# Patient Record
Sex: Female | Born: 1980
Health system: Southern US, Community
[De-identification: ages and names within clinical notes are randomized; demographics above are authoritative.]

## PROBLEM LIST (undated history)

## (undated) DIAGNOSIS — I10 Essential (primary) hypertension: Secondary | ICD-10-CM

## (undated) DIAGNOSIS — R519 Headache, unspecified: Secondary | ICD-10-CM

## (undated) DIAGNOSIS — R200 Anesthesia of skin: Secondary | ICD-10-CM

## (undated) DIAGNOSIS — G43909 Migraine, unspecified, not intractable, without status migrainosus: Secondary | ICD-10-CM

## (undated) DIAGNOSIS — R202 Paresthesia of skin: Secondary | ICD-10-CM

## (undated) HISTORY — PX: SPINAL FUSION: SHX223

## (undated) HISTORY — DX: Headache, unspecified: R51.9

## (undated) HISTORY — DX: Paresthesia of skin: R20.2

## (undated) HISTORY — DX: Essential (primary) hypertension: I10

## (undated) HISTORY — DX: Migraine, unspecified, not intractable, without status migrainosus: G43.909

## (undated) HISTORY — PX: BREAST EXCISIONAL BIOPSY: SUR124

## (undated) HISTORY — DX: Anesthesia of skin: R20.0

---

## 2004-06-20 ENCOUNTER — Emergency Department: Payer: Self-pay | Admitting: Emergency Medicine

## 2004-10-16 ENCOUNTER — Emergency Department: Payer: Self-pay | Admitting: Emergency Medicine

## 2004-12-17 ENCOUNTER — Encounter: Payer: Self-pay | Admitting: Neurosurgery

## 2004-12-27 ENCOUNTER — Encounter: Payer: Self-pay | Admitting: Neurosurgery

## 2005-01-27 ENCOUNTER — Encounter: Payer: Self-pay | Admitting: Neurosurgery

## 2005-01-31 ENCOUNTER — Observation Stay (HOSPITAL_COMMUNITY): Admission: RE | Admit: 2005-01-31 | Discharge: 2005-02-01 | Payer: Self-pay | Admitting: Neurosurgery

## 2005-02-01 ENCOUNTER — Emergency Department: Payer: Self-pay | Admitting: Emergency Medicine

## 2005-02-24 ENCOUNTER — Encounter: Admission: RE | Admit: 2005-02-24 | Discharge: 2005-02-24 | Payer: Self-pay | Admitting: Neurosurgery

## 2005-04-22 ENCOUNTER — Encounter: Admission: RE | Admit: 2005-04-22 | Discharge: 2005-04-22 | Payer: Self-pay | Admitting: Neurosurgery

## 2005-05-10 ENCOUNTER — Encounter: Payer: Self-pay | Admitting: Neurosurgery

## 2005-05-29 ENCOUNTER — Encounter: Payer: Self-pay | Admitting: Neurosurgery

## 2005-06-29 ENCOUNTER — Encounter: Payer: Self-pay | Admitting: Neurosurgery

## 2005-07-29 ENCOUNTER — Encounter: Payer: Self-pay | Admitting: Neurosurgery

## 2005-08-29 ENCOUNTER — Encounter: Payer: Self-pay | Admitting: Neurosurgery

## 2005-09-29 ENCOUNTER — Encounter: Payer: Self-pay | Admitting: Neurosurgery

## 2005-10-27 ENCOUNTER — Encounter: Payer: Self-pay | Admitting: Neurosurgery

## 2007-05-08 ENCOUNTER — Ambulatory Visit: Payer: Self-pay | Admitting: General Practice

## 2007-12-13 ENCOUNTER — Ambulatory Visit: Payer: Self-pay | Admitting: Anesthesiology

## 2007-12-31 ENCOUNTER — Ambulatory Visit: Payer: Self-pay | Admitting: Anesthesiology

## 2008-09-05 ENCOUNTER — Observation Stay: Payer: Self-pay | Admitting: Obstetrics and Gynecology

## 2008-11-07 ENCOUNTER — Observation Stay: Payer: Self-pay | Admitting: Obstetrics and Gynecology

## 2008-11-10 ENCOUNTER — Observation Stay: Payer: Self-pay | Admitting: Obstetrics and Gynecology

## 2008-11-13 ENCOUNTER — Observation Stay: Payer: Self-pay | Admitting: Obstetrics and Gynecology

## 2008-12-01 ENCOUNTER — Ambulatory Visit: Payer: Self-pay | Admitting: Obstetrics and Gynecology

## 2008-12-02 ENCOUNTER — Inpatient Hospital Stay: Payer: Self-pay | Admitting: Obstetrics and Gynecology

## 2009-02-18 ENCOUNTER — Ambulatory Visit: Payer: Self-pay | Admitting: Anesthesiology

## 2009-03-18 ENCOUNTER — Ambulatory Visit: Payer: Self-pay | Admitting: Anesthesiology

## 2009-05-19 ENCOUNTER — Ambulatory Visit: Payer: Self-pay | Admitting: Anesthesiology

## 2009-07-21 ENCOUNTER — Ambulatory Visit: Payer: Self-pay | Admitting: Anesthesiology

## 2009-09-30 ENCOUNTER — Ambulatory Visit: Payer: Self-pay | Admitting: Anesthesiology

## 2009-12-24 ENCOUNTER — Ambulatory Visit: Payer: Self-pay | Admitting: Anesthesiology

## 2010-02-16 ENCOUNTER — Ambulatory Visit: Payer: Self-pay | Admitting: Anesthesiology

## 2010-11-10 ENCOUNTER — Ambulatory Visit: Payer: Self-pay | Admitting: Pain Medicine

## 2011-01-10 ENCOUNTER — Ambulatory Visit: Payer: Self-pay | Admitting: Pain Medicine

## 2011-05-25 ENCOUNTER — Ambulatory Visit: Payer: Self-pay | Admitting: General Surgery

## 2011-06-06 ENCOUNTER — Ambulatory Visit: Payer: Self-pay | Admitting: Anesthesiology

## 2011-06-10 ENCOUNTER — Ambulatory Visit: Payer: Self-pay | Admitting: General Surgery

## 2011-06-13 HISTORY — PX: BREAST BIOPSY: SHX20

## 2011-06-13 LAB — PATHOLOGY REPORT

## 2015-08-15 ENCOUNTER — Other Ambulatory Visit: Payer: Self-pay | Admitting: Physician Assistant

## 2015-10-26 ENCOUNTER — Other Ambulatory Visit: Payer: Self-pay | Admitting: Family Medicine

## 2015-10-26 DIAGNOSIS — Z1231 Encounter for screening mammogram for malignant neoplasm of breast: Secondary | ICD-10-CM

## 2015-11-18 ENCOUNTER — Other Ambulatory Visit: Payer: Self-pay | Admitting: Physician Assistant

## 2015-11-25 ENCOUNTER — Ambulatory Visit: Payer: Self-pay

## 2015-12-06 ENCOUNTER — Other Ambulatory Visit: Payer: Self-pay | Admitting: Physician Assistant

## 2015-12-07 ENCOUNTER — Other Ambulatory Visit: Payer: Self-pay | Admitting: Physician Assistant

## 2016-04-28 ENCOUNTER — Encounter: Payer: Self-pay | Admitting: Obstetrics and Gynecology

## 2016-11-21 ENCOUNTER — Inpatient Hospital Stay
Admission: RE | Admit: 2016-11-21 | Discharge: 2016-11-21 | Disposition: A | Discharge status: Home / Self Care | Payer: Self-pay | Source: Ambulatory Visit | Attending: *Deleted | Admitting: *Deleted

## 2016-11-21 ENCOUNTER — Other Ambulatory Visit: Payer: Self-pay | Admitting: *Deleted

## 2016-11-21 ENCOUNTER — Other Ambulatory Visit: Payer: Self-pay | Admitting: Family Medicine

## 2016-11-21 DIAGNOSIS — Z1231 Encounter for screening mammogram for malignant neoplasm of breast: Secondary | ICD-10-CM

## 2016-11-30 ENCOUNTER — Ambulatory Visit
Admission: RE | Admit: 2016-11-30 | Discharge: 2016-11-30 | Disposition: A | Discharge status: Home / Self Care | Payer: BLUE CROSS/BLUE SHIELD | Source: Ambulatory Visit | Attending: Family Medicine | Admitting: Family Medicine

## 2016-11-30 DIAGNOSIS — Z1231 Encounter for screening mammogram for malignant neoplasm of breast: Secondary | ICD-10-CM

## 2019-01-22 ENCOUNTER — Other Ambulatory Visit: Payer: Self-pay

## 2019-01-22 ENCOUNTER — Ambulatory Visit
Admission: RE | Admit: 2019-01-22 | Discharge: 2019-01-22 | Disposition: A | Discharge status: Home / Self Care | Payer: BLUE CROSS/BLUE SHIELD | Source: Ambulatory Visit | Attending: Pain Medicine | Admitting: Pain Medicine

## 2019-01-22 ENCOUNTER — Ambulatory Visit
Admission: RE | Admit: 2019-01-22 | Discharge: 2019-01-22 | Disposition: A | Discharge status: Home / Self Care | Payer: BLUE CROSS/BLUE SHIELD | Attending: Pain Medicine | Admitting: Pain Medicine

## 2019-01-22 ENCOUNTER — Other Ambulatory Visit: Payer: Self-pay | Admitting: Pain Medicine

## 2019-01-22 DIAGNOSIS — M545 Low back pain, unspecified: Secondary | ICD-10-CM

## 2019-01-29 ENCOUNTER — Other Ambulatory Visit: Payer: Self-pay | Admitting: Pain Medicine

## 2019-01-29 DIAGNOSIS — M545 Low back pain, unspecified: Secondary | ICD-10-CM

## 2019-02-26 ENCOUNTER — Telehealth: Payer: Self-pay | Admitting: Internal Medicine

## 2019-02-26 ENCOUNTER — Telehealth: Payer: Self-pay | Admitting: *Deleted

## 2019-02-26 ENCOUNTER — Other Ambulatory Visit: Payer: BLUE CROSS/BLUE SHIELD

## 2019-02-26 DIAGNOSIS — M545 Low back pain, unspecified: Secondary | ICD-10-CM

## 2019-02-26 DIAGNOSIS — Z20822 Contact with and (suspected) exposure to covid-19: Secondary | ICD-10-CM

## 2019-02-26 NOTE — Telephone Encounter (Signed)
°  1. Do you have a fever? Low grade (100) 2. Are you having chills? no 3. Do you have a sore throat? no 4. Are you experiencing resp S/Sx -cough, SOB? yes 5. Do you have muscle aches? yes 6. Are you experiencing N/V/D? yes 7. Experiencing loss of sense of taste and/or smell? no 8. Do you have a headache? no 9. Have you had contact with a person confirmed Positive for Covid-19? maybe 10. Have you traveled outside of [place]? No  Symptoms started about a week ago. She also has extreme sweating. [place] and just found out that couple of the servers/cooks were she ate every day tested +.   Lives with her girlfriend and 2 kids. They were all on the trip also. None of them have symptoms but her. Her girlfriend Cecille Rubin works for [employer]. She is off till Friday of this week and [place] [employer].

## 2019-02-26 NOTE — Telephone Encounter (Signed)
[facility]- request COVID testing  Patient is symptomatic

## 2019-02-26 NOTE — Telephone Encounter (Signed)
Reviewed with Dr. Roxan Hockey.  Said because a restaurant is involved, need to refer April Griffith to the [facility].  Spoke with Francisca & advised her to contact [facility] for guidance & gave her their [place] phone number  [phone].  Advised her to keep [place] informed.

## 2019-03-05 ENCOUNTER — Other Ambulatory Visit: Payer: BLUE CROSS/BLUE SHIELD

## 2019-03-05 LAB — NOVEL CORONAVIRUS, NAA: SARS-CoV-2, NAA: NOT DETECTED

## 2019-03-20 DIAGNOSIS — G894 Chronic pain syndrome: Secondary | ICD-10-CM | POA: Diagnosis not present

## 2019-03-20 DIAGNOSIS — Z79891 Long term (current) use of opiate analgesic: Secondary | ICD-10-CM | POA: Diagnosis not present

## 2019-03-20 DIAGNOSIS — M961 Postlaminectomy syndrome, not elsewhere classified: Secondary | ICD-10-CM | POA: Diagnosis not present

## 2019-03-20 DIAGNOSIS — Z79899 Other long term (current) drug therapy: Secondary | ICD-10-CM | POA: Diagnosis not present

## 2019-03-20 DIAGNOSIS — M5136 Other intervertebral disc degeneration, lumbar region: Secondary | ICD-10-CM | POA: Diagnosis not present

## 2019-03-20 DIAGNOSIS — M545 Low back pain: Secondary | ICD-10-CM | POA: Diagnosis not present

## 2019-03-23 ENCOUNTER — Other Ambulatory Visit: Payer: Self-pay

## 2019-03-23 ENCOUNTER — Ambulatory Visit
Admission: RE | Admit: 2019-03-23 | Discharge: 2019-03-23 | Disposition: A | Discharge status: Home / Self Care | Payer: 59 | Source: Ambulatory Visit | Attending: Pain Medicine | Admitting: Pain Medicine

## 2019-04-02 ENCOUNTER — Encounter: Payer: Self-pay | Admitting: *Deleted

## 2019-04-02 ENCOUNTER — Telehealth: Payer: Self-pay | Admitting: Obstetrics and Gynecology

## 2019-04-02 NOTE — Telephone Encounter (Signed)
The patient called and stated that she is experiencing bad hot flashes that have been worsening over the last three weeks. The patient is wanting to know if she needs to come in sooner than her scheduled apt. Please advise.

## 2019-04-02 NOTE — Telephone Encounter (Signed)
Sent pt mcm-ac 

## 2019-04-11 ENCOUNTER — Telehealth: Payer: Self-pay | Admitting: Internal Medicine

## 2019-04-11 ENCOUNTER — Other Ambulatory Visit: Payer: Self-pay | Admitting: Internal Medicine

## 2019-04-11 MED ORDER — DULOXETINE HCL 30 MG PO CPEP: 30.0000 mg | ORAL_CAPSULE | Freq: Every day | ORAL | 3 refills | Status: DC

## 2019-04-11 NOTE — Progress Notes (Signed)
Refilled the cymbalta as her pain med doc who prescribed this prior (changed her from prozac to this) would not refill.

## 2019-04-11 NOTE — Telephone Encounter (Signed)
Pt states that Dr Roxan Hockey put her on Prozac and her pain management MD changed her to Cymbalta since it would help her depression and back pain.  She called to get a refill from her pain management MD and they say she needs to get refills from Dr Roxan Hockey for Cymbalta. She says she tried to stop Cymbalta for 3 weeks and felt horrible so she needs a refill. She said if Dr Roxan Hockey isnt ok with witting for Cymbalta she is ok going back on Prozac. She just needed something for her depression.

## 2019-04-11 NOTE — Telephone Encounter (Signed)
Refilled the cymbalta for her

## 2019-04-11 NOTE — Telephone Encounter (Signed)
Needs a refill on her cymbalta however the last person to fill it was her pain mang. Dr. He told her that she needs to get it refilled through her pcp. She was on Prozac but the pain mang. Dr changed it to cymbalta.

## 2019-04-17 DIAGNOSIS — M961 Postlaminectomy syndrome, not elsewhere classified: Secondary | ICD-10-CM | POA: Diagnosis not present

## 2019-04-17 DIAGNOSIS — G894 Chronic pain syndrome: Secondary | ICD-10-CM | POA: Diagnosis not present

## 2019-04-17 DIAGNOSIS — M5136 Other intervertebral disc degeneration, lumbar region: Secondary | ICD-10-CM | POA: Diagnosis not present

## 2019-04-17 DIAGNOSIS — M545 Low back pain: Secondary | ICD-10-CM | POA: Diagnosis not present

## 2019-04-19 ENCOUNTER — Encounter: Payer: Self-pay | Admitting: Obstetrics and Gynecology

## 2019-05-16 DIAGNOSIS — Z79899 Other long term (current) drug therapy: Secondary | ICD-10-CM | POA: Diagnosis not present

## 2019-05-16 DIAGNOSIS — G894 Chronic pain syndrome: Secondary | ICD-10-CM | POA: Diagnosis not present

## 2019-05-16 DIAGNOSIS — Z79891 Long term (current) use of opiate analgesic: Secondary | ICD-10-CM | POA: Diagnosis not present

## 2019-05-16 DIAGNOSIS — M5136 Other intervertebral disc degeneration, lumbar region: Secondary | ICD-10-CM | POA: Diagnosis not present

## 2019-05-16 DIAGNOSIS — M961 Postlaminectomy syndrome, not elsewhere classified: Secondary | ICD-10-CM | POA: Diagnosis not present

## 2019-05-21 ENCOUNTER — Encounter: Payer: Self-pay | Admitting: Obstetrics and Gynecology

## 2019-06-17 DIAGNOSIS — M5136 Other intervertebral disc degeneration, lumbar region: Secondary | ICD-10-CM | POA: Diagnosis not present

## 2019-06-17 DIAGNOSIS — Z79891 Long term (current) use of opiate analgesic: Secondary | ICD-10-CM | POA: Diagnosis not present

## 2019-06-17 DIAGNOSIS — Z79899 Other long term (current) drug therapy: Secondary | ICD-10-CM | POA: Diagnosis not present

## 2019-06-17 DIAGNOSIS — M545 Low back pain: Secondary | ICD-10-CM | POA: Diagnosis not present

## 2019-06-17 DIAGNOSIS — M961 Postlaminectomy syndrome, not elsewhere classified: Secondary | ICD-10-CM | POA: Diagnosis not present

## 2019-06-17 DIAGNOSIS — G894 Chronic pain syndrome: Secondary | ICD-10-CM | POA: Diagnosis not present

## 2019-07-04 ENCOUNTER — Encounter: Payer: Self-pay | Admitting: Obstetrics and Gynecology

## 2019-07-15 DIAGNOSIS — M5136 Other intervertebral disc degeneration, lumbar region: Secondary | ICD-10-CM | POA: Diagnosis not present

## 2019-07-15 DIAGNOSIS — G894 Chronic pain syndrome: Secondary | ICD-10-CM | POA: Diagnosis not present

## 2019-07-15 DIAGNOSIS — M961 Postlaminectomy syndrome, not elsewhere classified: Secondary | ICD-10-CM | POA: Diagnosis not present

## 2019-07-17 ENCOUNTER — Other Ambulatory Visit: Payer: Self-pay

## 2019-07-17 DIAGNOSIS — K219 Gastro-esophageal reflux disease without esophagitis: Secondary | ICD-10-CM

## 2019-07-17 MED ORDER — PANTOPRAZOLE SODIUM 20 MG PO TBEC: 20.0000 mg | DELAYED_RELEASE_TABLET | Freq: Every day | ORAL | 2 refills | Status: DC

## 2019-07-17 NOTE — Telephone Encounter (Signed)
Labs 11/22/2017.  Will have magnesium  Level with her next physical as chronic PPI use since 2019 Rx.  Last physical 12/21/2017 with Dr Cheryll Cockayne.  Due to covid pandemic patients have option to delay annual physical/labs.  Electronic Rx sent to her pharmacy of choice for 90 day supply.  Patient due labs and office visit.

## 2019-07-18 ENCOUNTER — Telehealth: Payer: Self-pay

## 2019-07-18 DIAGNOSIS — Z03818 Encounter for observation for suspected exposure to other biological agents ruled out: Secondary | ICD-10-CM | POA: Diagnosis not present

## 2019-07-18 NOTE — Telephone Encounter (Signed)
Received office notes from Park Liter, PA-C of [facility] for office visit on 07/15/2019.  Reviewed by Gerarda Fraction, NP-C (Interim Provider) and noted as follows - "Reviewed 3 pages of 3.  Pain Mgmt back pain.  Pt received hydrocodone/acetaminophen 10/325 mg refill."  AMD

## 2019-07-30 ENCOUNTER — Other Ambulatory Visit: Payer: Self-pay

## 2019-07-30 DIAGNOSIS — G4700 Insomnia, unspecified: Secondary | ICD-10-CM

## 2019-07-31 MED ORDER — TRAZODONE HCL 50 MG PO TABS: ORAL_TABLET | ORAL | 0 refills | Status: DC

## 2019-07-31 NOTE — Telephone Encounter (Signed)
Patient sees pain management last appt 15 Jul 2019 received hydrocodone #120 RF0.  Has controlled substances agreement on file with Dr Roxan Hockey dated 10/02/2018.  Has been getting tramadol Rx since 2012 at [facility] per paper chart review for chronic pain s/p spinal fusion L4-S1 2006/2007/migraines.  Drug screen 04/2019.  Labs mar 2019 rescheduled her appt to have labs 09/03/2019 and appt 09/10/2019.  Electronic Rx sent to her pharmacy of choice Trazodone 50mg  1-2 po qhs #60 RF0

## 2019-08-21 ENCOUNTER — Telehealth: Payer: Self-pay

## 2019-08-21 NOTE — Telephone Encounter (Signed)
Received office notes from [facility] dated 08/19/2019 from Park Liter, [place], PA-C.  Reviewed by Randel Pigg, PA-C on 08/21/2019.  AMD

## 2019-09-12 ENCOUNTER — Other Ambulatory Visit: Payer: Self-pay | Admitting: Internal Medicine

## 2019-09-17 ENCOUNTER — Encounter: Payer: Self-pay | Admitting: Certified Nurse Midwife

## 2019-10-01 ENCOUNTER — Encounter: Payer: Self-pay | Admitting: Registered Nurse

## 2019-10-01 ENCOUNTER — Other Ambulatory Visit: Payer: Self-pay

## 2019-10-01 ENCOUNTER — Other Ambulatory Visit: Payer: Self-pay | Admitting: Registered Nurse

## 2019-10-01 ENCOUNTER — Ambulatory Visit: Payer: Self-pay

## 2019-10-01 DIAGNOSIS — G4700 Insomnia, unspecified: Secondary | ICD-10-CM

## 2019-10-01 DIAGNOSIS — Z0000 Encounter for general adult medical examination without abnormal findings: Secondary | ICD-10-CM

## 2019-10-01 LAB — POCT URINALYSIS DIPSTICK
Bilirubin, UA: NEGATIVE
Blood, UA: NEGATIVE
Glucose, UA: NEGATIVE
Ketones, UA: NEGATIVE
Leukocytes, UA: NEGATIVE
Nitrite, UA: NEGATIVE
Protein, UA: POSITIVE — AB
Spec Grav, UA: >=1.03 — AB (ref 1.010–1.025)
Urobilinogen, UA: 0.2 E.U./dL
pH, UA: 5.5 (ref 5.0–8.0)

## 2019-10-01 NOTE — Telephone Encounter (Signed)
Lab results pending drawn today.  EKG sinus rhythm today.  Electronic Rx sent to her pharmacy of choice bridge refill to appt later this month with me.  Patient has been on medication chronically with [facility] provider oversight.

## 2019-10-01 NOTE — Progress Notes (Signed)
Scheduled to complete physical 10/09/2019 with Albina Billet, NP-C (Interim Provider).  AMD

## 2019-10-01 NOTE — Telephone Encounter (Signed)
April Griffith was in the office today for labs & scheduled to complete her physical 10/09/19 with you..  Last office visit 11/26/2018 with Dr. Dorris Fetch.  Sees Dr. Ardell Isaacs of [facility]. Last visit there was 09/16/2019 & she's very pleased with this practice for managing her chronic pain.  AMD

## 2019-10-02 LAB — CMP12+LP+TP+TSH+6AC+CBC/D/PLT
ALT: 17 IU/L (ref 0–32)
AST: 14 IU/L (ref 0–40)
Albumin/Globulin Ratio: 1.8 (ref 1.2–2.2)
Albumin: 4.7 g/dL (ref 3.8–4.8)
Alkaline Phosphatase: 69 IU/L (ref 39–117)
BUN/Creatinine Ratio: 15 (ref 9–23)
BUN: 11 mg/dL (ref 6–20)
Basophils Absolute: 0 10*3/uL (ref 0.0–0.2)
Basos: 0 %
Bilirubin Total: 0.6 mg/dL (ref 0.0–1.2)
Calcium: 9.7 mg/dL (ref 8.7–10.2)
Chloride: 103 mmol/L (ref 96–106)
Chol/HDL Ratio: 3.6 ratio (ref 0.0–4.4)
Cholesterol, Total: 178 mg/dL (ref 100–199)
Creatinine, Ser: 0.72 mg/dL (ref 0.57–1.00)
EOS (ABSOLUTE): 0 10*3/uL (ref 0.0–0.4)
Eos: 0 %
Estimated CHD Risk: 0.7 times avg. (ref 0.0–1.0)
Free Thyroxine Index: 2.2 (ref 1.2–4.9)
GFR calc Af Amer: 122 mL/min/{1.73_m2} (ref >59)
GFR calc non Af Amer: 106 mL/min/{1.73_m2} (ref >59)
GGT: 24 IU/L (ref 0–60)
Globulin, Total: 2.6 g/dL (ref 1.5–4.5)
Glucose: 135 mg/dL — ABNORMAL HIGH (ref 65–99)
HDL: 49 mg/dL (ref >39)
Hematocrit: 41.2 % (ref 34.0–46.6)
Hemoglobin: 14.1 g/dL (ref 11.1–15.9)
Immature Grans (Abs): 0 10*3/uL (ref 0.0–0.1)
Immature Granulocytes: 0 %
Iron: 42 ug/dL (ref 27–159)
LDH: 115 IU/L — ABNORMAL LOW (ref 119–226)
LDL Chol Calc (NIH): 113 mg/dL — ABNORMAL HIGH (ref 0–99)
Lymphocytes Absolute: 2 10*3/uL (ref 0.7–3.1)
Lymphs: 15 %
MCH: 29.1 pg (ref 26.6–33.0)
MCHC: 34.2 g/dL (ref 31.5–35.7)
MCV: 85 fL (ref 79–97)
Monocytes Absolute: 0.2 10*3/uL (ref 0.1–0.9)
Monocytes: 2 %
Neutrophils Absolute: 10.5 10*3/uL — ABNORMAL HIGH (ref 1.4–7.0)
Neutrophils: 83 %
Phosphorus: 4.6 mg/dL — ABNORMAL HIGH (ref 3.0–4.3)
Platelets: 433 10*3/uL (ref 150–450)
Potassium: 4.1 mmol/L (ref 3.5–5.2)
RBC: 4.85 x10E6/uL (ref 3.77–5.28)
RDW: 12.7 % (ref 11.7–15.4)
Sodium: 140 mmol/L (ref 134–144)
T3 Uptake Ratio: 27 % (ref 24–39)
T4, Total: 8.1 ug/dL (ref 4.5–12.0)
TSH: 1.11 u[IU]/mL (ref 0.450–4.500)
Total Protein: 7.3 g/dL (ref 6.0–8.5)
Triglycerides: 84 mg/dL (ref 0–149)
Uric Acid: 3.3 mg/dL (ref 2.6–6.2)
VLDL Cholesterol Cal: 16 mg/dL (ref 5–40)
WBC: 12.7 10*3/uL — ABNORMAL HIGH (ref 3.4–10.8)

## 2019-10-02 NOTE — Progress Notes (Signed)
Called [facility] Customer Service at  [phone] to add test [phone] (A1c) as requested by Albina Billet, NP-C.  AMD

## 2019-10-02 NOTE — Progress Notes (Signed)
Reviewed paper chart at [facility] EKG unchanged from 2017, 2016, 2013 EKGS RSR v1; RSR V2 2017 and SR 2018  My chart message sent to patient

## 2019-10-02 NOTE — Progress Notes (Signed)
Add HgbA1c to [facility] and verify with patient she was fasting for sample. Also clarify if recent signs URI/sinus/ear infection or any other symptoms as slightly elevated WBC and neutrophils.   Noted patient had trace/15+ protein urinalysis on previous physicals in 2016,2017 and 2019 per chart review paper at [facility].  Stable.  Avoid OTC medications without first consulting provider/pharmacist and avoid dehydration/drink water to keep urine pale yellow and clear and voiding every 4 hours while awake.  Blood sugar, phosphorus, LDL and WBC/neutrophils elevated and LDH low (not present on 2019 labs). Will give patient paper copy of labs at scheduled appt next week.  Follow up scheduled will discuss elevated cholesterol, blood sugar, WBC/neutrophils.  Continue to monitor phosphorus annually.  Will give proteinuria, prediabetes, cholesterol handouts at appt.  I recommend weight loss, exercise 150 minutes per week; dietary fiber daily by mouth 20 grams women per up to date; eat whole grains/fruits/vegetables; keep added sugars to less than 100 calories/ 5 teaspoons for women per American Heart Association;  electrolytes, iron, serum kidney/liver function, and thyroid normal  No anemia My chart message sent to patient "April Griffith, I have asked RN Dobbins to add HgbA1c (3 month blood sugar average) to [facility] and we should have those results tomorrow.  Your spot blood sugar was elevated.  Had you had anything to eat or drink prior to blood draw that wasn't black coffee or water after midnight? Also your white blood cells(WBC)/neutrophils were slightly elevated have you recently recovered from any illness or seem like you are fighting a cold or sinus infection? I saw you were tested for covid and negative on 09/26/2019 and positive on 07/29/2019. Your urine had trace/15+ protein urinalysis again seen on previous physicals in 2016,2017 and 2019 per chart review paper at [facility].  Stable.  Avoid over the  counter medications without first consulting provider/pharmacist and avoid dehydration/drink water to keep urine pale yellow and clear and voiding every 4 hours while awake.  Blood sugar, phosphorus, LDL (cholesterol) and WBC/neutrophils elevated and LDH (liver function) low (not present on 2019 labs).  Elevated phosphorus can occur with elevated blood sugar or kidney function decreased or if increased oral intake.  We will give you a paper copy of labs at scheduled appt next week.  Please keep your follow up appt with me to discuss elevated cholesterol, blood sugar, WBC/neutrophils.  Continue to monitor phosphorus annually.  Will give proteinuria, prediabetes, cholesterol handouts at appt.  I recommend weight loss, exercise 150 minutes per week; dietary fiber daily by mouth 20 grams women per up to date; eat whole grains/fruits/vegetables; keep added sugars to less than 100 calories/ 5 teaspoons for women per American Heart Association;  electrolytes, iron, serum kidney/liver function, and thyroid normal  No anemia Sincerely, April Billet NP-C"

## 2019-10-02 NOTE — Addendum Note (Signed)
Addended by: Gardner Candle on: 10/02/2019 08:42 AM   Modules accepted: Orders

## 2019-10-03 LAB — SPECIMEN STATUS REPORT

## 2019-10-03 LAB — HGB A1C W/O EAG: Hgb A1c MFr Bld: 5.4 % (ref 4.8–5.6)

## 2019-10-09 ENCOUNTER — Other Ambulatory Visit: Payer: Self-pay

## 2019-10-09 ENCOUNTER — Ambulatory Visit: Payer: Self-pay | Admitting: Registered Nurse

## 2019-10-09 VITALS — BP 110/78 | HR 76 | Temp 99.3°F | Resp 12 | Ht 64.0 in | Wt 163.0 lb

## 2019-10-09 DIAGNOSIS — F419 Anxiety disorder, unspecified: Secondary | ICD-10-CM

## 2019-10-09 DIAGNOSIS — R801 Persistent proteinuria, unspecified: Secondary | ICD-10-CM

## 2019-10-09 DIAGNOSIS — K589 Irritable bowel syndrome without diarrhea: Secondary | ICD-10-CM | POA: Insufficient documentation

## 2019-10-09 DIAGNOSIS — F32A Depression, unspecified: Secondary | ICD-10-CM | POA: Insufficient documentation

## 2019-10-09 DIAGNOSIS — G250 Essential tremor: Secondary | ICD-10-CM | POA: Insufficient documentation

## 2019-10-09 DIAGNOSIS — G4700 Insomnia, unspecified: Secondary | ICD-10-CM

## 2019-10-09 DIAGNOSIS — G43109 Migraine with aura, not intractable, without status migrainosus: Secondary | ICD-10-CM | POA: Insufficient documentation

## 2019-10-09 DIAGNOSIS — L719 Rosacea, unspecified: Secondary | ICD-10-CM

## 2019-10-09 DIAGNOSIS — J0101 Acute recurrent maxillary sinusitis: Secondary | ICD-10-CM

## 2019-10-09 DIAGNOSIS — K581 Irritable bowel syndrome with constipation: Secondary | ICD-10-CM

## 2019-10-09 DIAGNOSIS — G8929 Other chronic pain: Secondary | ICD-10-CM

## 2019-10-09 DIAGNOSIS — I10 Essential (primary) hypertension: Secondary | ICD-10-CM | POA: Insufficient documentation

## 2019-10-09 DIAGNOSIS — E785 Hyperlipidemia, unspecified: Secondary | ICD-10-CM

## 2019-10-09 DIAGNOSIS — E663 Overweight: Secondary | ICD-10-CM | POA: Insufficient documentation

## 2019-10-09 DIAGNOSIS — L661 Lichen planopilaris: Secondary | ICD-10-CM

## 2019-10-09 DIAGNOSIS — K219 Gastro-esophageal reflux disease without esophagitis: Secondary | ICD-10-CM

## 2019-10-09 DIAGNOSIS — F329 Major depressive disorder, single episode, unspecified: Secondary | ICD-10-CM | POA: Insufficient documentation

## 2019-10-09 DIAGNOSIS — G43909 Migraine, unspecified, not intractable, without status migrainosus: Secondary | ICD-10-CM | POA: Insufficient documentation

## 2019-10-09 DIAGNOSIS — M549 Dorsalgia, unspecified: Secondary | ICD-10-CM | POA: Insufficient documentation

## 2019-10-09 DIAGNOSIS — Z6827 Body mass index (BMI) 27.0-27.9, adult: Secondary | ICD-10-CM

## 2019-10-09 DIAGNOSIS — J309 Allergic rhinitis, unspecified: Secondary | ICD-10-CM | POA: Insufficient documentation

## 2019-10-09 DIAGNOSIS — R0609 Other forms of dyspnea: Secondary | ICD-10-CM

## 2019-10-09 DIAGNOSIS — Z0000 Encounter for general adult medical examination without abnormal findings: Secondary | ICD-10-CM

## 2019-10-09 MED ORDER — AMOXICILLIN-POT CLAVULANATE 875-125 MG PO TABS: 1.0000 | ORAL_TABLET | Freq: Two times a day (BID) | ORAL | 0 refills | Status: DC

## 2019-10-09 MED ORDER — SALINE SPRAY 0.65 % NA SOLN: 2.0000 | NASAL | 0 refills | Status: AC

## 2019-10-09 MED ORDER — FLUCONAZOLE 150 MG PO TABS: 150.0000 mg | ORAL_TABLET | Freq: Once | ORAL | 0 refills | Status: DC

## 2019-10-09 NOTE — Patient Instructions (Addendum)
High Cholesterol  High cholesterol is a condition in which the blood has high levels of a white, waxy, fat-like substance (cholesterol). The human body needs small amounts of cholesterol. The liver makes all the cholesterol that the body needs. Extra (excess) cholesterol comes from the food that we eat. Cholesterol is carried from the liver by the blood through the blood vessels. If you have high cholesterol, deposits (plaques) may build up on the walls of your blood vessels (arteries). Plaques make the arteries narrower and stiffer. Cholesterol plaques increase your risk for heart attack and stroke. Work with your health care provider to keep your cholesterol levels in a healthy range. What increases the risk? This condition is more likely to develop in people who:  Eat foods that are high in animal fat (saturated fat) or cholesterol.  Are overweight.  Are not getting enough exercise.  Have a family history of high cholesterol. What are the signs or symptoms? There are no symptoms of this condition. How is this diagnosed? This condition may be diagnosed from the results of a blood test.  If you are older than age 21, your health care provider may check your cholesterol every 4-6 years.  You may be checked more often if you already have high cholesterol or other risk factors for heart disease. The blood test for cholesterol measures:  "Bad" cholesterol (LDL cholesterol). This is the main type of cholesterol that causes heart disease. The desired level for LDL is less than 100.  "Good" cholesterol (HDL cholesterol). This type helps to protect against heart disease by cleaning the arteries and carrying the LDL away. The desired level for HDL is 60 or higher.  Triglycerides. These are fats that the body can store or burn for energy. The desired number for triglycerides is lower than 150.  Total cholesterol. This is a measure of the total amount of cholesterol in your blood, including LDL  cholesterol, HDL cholesterol, and triglycerides. A healthy number is less than 200. How is this treated? This condition is treated with diet changes, lifestyle changes, and medicines. Diet changes  This may include eating more whole grains, fruits, vegetables, nuts, and fish.  This may also include cutting back on red meat and foods that have a lot of added sugar. Lifestyle changes  Changes may include getting at least 40 minutes of aerobic exercise 3 times a week. Aerobic exercises include walking, biking, and swimming. Aerobic exercise along with a healthy diet can help you maintain a healthy weight.  Changes may also include quitting smoking. Medicines  Medicines are usually given if diet and lifestyle changes have failed to reduce your cholesterol to healthy levels.  Your health care provider may prescribe a statin medicine. Statin medicines have been shown to reduce cholesterol, which can reduce the risk of heart disease. Follow these instructions at home: Eating and drinking If told by your health care provider:  Eat chicken (without skin), fish, veal, shellfish, ground [place] breast, and round or loin cuts of red meat.  Do not eat fried foods or fatty meats, such as hot dogs and salami.  Eat plenty of fruits, such as apples.  Eat plenty of vegetables, such as broccoli, potatoes, and carrots.  Eat beans, peas, and lentils.  Eat grains such as barley, rice, couscous, and bulgur wheat.  Eat pasta without cream sauces.  Use skim or nonfat milk, and eat low-fat or nonfat yogurt and cheeses.  Do not eat or drink whole milk, cream, ice cream, egg yolks,  or hard cheeses.  Do not eat stick margarine or tub margarines that contain trans fats (also called partially hydrogenated oils).  Do not eat saturated tropical oils, such as coconut oil and palm oil.  Do not eat cakes, cookies, crackers, or other baked goods that contain trans fats.  General instructions  Exercise as  directed by your health care provider. Increase your activity level with activities such as gardening, walking, and taking the stairs.  Take over-the-counter and prescription medicines only as told by your health care provider.  Do not use any products that contain nicotine or tobacco, such as cigarettes and e-cigarettes. If you need help quitting, ask your health care provider.  Keep all follow-up visits as told by your health care provider. This is important. Contact a health care provider if:  You are struggling to maintain a healthy diet or weight.  You need help to start on an exercise program.  You need help to stop smoking. Get help right away if:  You have chest pain.  You have trouble breathing. This information is not intended to replace advice given to you by your health care provider. Make sure you discuss any questions you have with your health care provider. Document Revised: 08/18/2017 Document Reviewed: 02/13/2016 Elsevier Patient Education  2020 [place]. Prediabetes Eating Plan Prediabetes is a condition that causes blood sugar (glucose) levels to be higher than normal. This increases the risk for developing diabetes. In order to prevent diabetes from developing, your health care provider may recommend a diet and other lifestyle changes to help you:  Control your blood glucose levels.  Improve your cholesterol levels.  Manage your blood pressure. Your health care provider may recommend working with a diet and nutrition specialist (dietitian) to make a meal plan that is best for you. What are tips for following this plan? Lifestyle  Set weight loss goals with the help of your health care team. It is recommended that most people with prediabetes lose 7% of their current body weight.  Exercise for at least 30 minutes at least 5 days a week.  Attend a support group or seek ongoing support from a mental health counselor.  Take over-the-counter and  prescription medicines only as told by your health care provider. Reading food labels  Read food labels to check the amount of fat, salt (sodium), and sugar in prepackaged foods. Avoid foods that have: ? Saturated fats. ? Trans fats. ? Added sugars.  Avoid foods that have more than 300 milligrams (mg) of sodium per serving. Limit your daily sodium intake to less than 2,300 mg each day. Shopping  Avoid buying pre-made and processed foods. Cooking  Cook with olive oil. Do not use butter, lard, or ghee.  Bake, broil, grill, or boil foods. Avoid frying. Meal planning   Work with your dietitian to develop an eating plan that is right for you. This may include: ? Tracking how many calories you take in. Use a food diary, notebook, or mobile application to track what you eat at each meal. ? Using the glycemic index (GI) to plan your meals. The index tells you how quickly a food will raise your blood glucose. Choose low-GI foods. These foods take a longer time to raise blood glucose.  Consider following a Mediterranean diet. This diet includes: ? Several servings each day of fresh fruits and vegetables. ? Eating fish at least twice a week. ? Several servings each day of whole grains, beans, nuts, and seeds. ? Using olive  oil instead of other fats. ? Moderate alcohol consumption. ? Eating small amounts of red meat and whole-fat dairy.  If you have high blood pressure, you may need to limit your sodium intake or follow a diet such as the DASH eating plan. DASH is an eating plan that aims to lower high blood pressure. What foods are recommended? The items listed below may not be a complete list. Talk with your dietitian about what dietary choices are best for you. Grains Whole grains, such as whole-wheat or whole-grain breads, crackers, cereals, and pasta. Unsweetened oatmeal. Bulgur. Barley. Quinoa. Brown rice. Corn or whole-wheat flour tortillas or taco shells. Vegetables Lettuce.  Spinach. Peas. Beets. Cauliflower. Cabbage. Broccoli. Carrots. Tomatoes. Squash. Eggplant. Herbs. Peppers. Onions. Cucumbers. Brussels sprouts. Fruits Berries. Bananas. Apples. Oranges. Grapes. Papaya. Mango. Pomegranate. Kiwi. Grapefruit. Cherries. Meats and other protein foods Seafood. Poultry without skin. Lean cuts of pork and beef. Tofu. Eggs. Nuts. Beans. Dairy Low-fat or fat-free dairy products, such as yogurt, cottage cheese, and cheese. Beverages Water. Tea. Coffee. Sugar-free or diet soda. Seltzer water. Lowfat or no-fat milk. Milk alternatives, such as soy or almond milk. Fats and oils Olive oil. Canola oil. Sunflower oil. Grapeseed oil. Avocado. Walnuts. Sweets and desserts Sugar-free or low-fat pudding. Sugar-free or low-fat ice cream and other frozen treats. Seasoning and other foods Herbs. Sodium-free spices. Mustard. Relish. Low-fat, low-sugar ketchup. Low-fat, low-sugar barbecue sauce. Low-fat or fat-free mayonnaise. What foods are not recommended? The items listed below may not be a complete list. Talk with your dietitian about what dietary choices are best for you. Grains Refined white flour and flour products, such as bread, pasta, snack foods, and cereals. Vegetables Canned vegetables. Frozen vegetables with butter or cream sauce. Fruits Fruits canned with syrup. Meats and other protein foods Fatty cuts of meat. Poultry with skin. Breaded or fried meat. Processed meats. Dairy Full-fat yogurt, cheese, or milk. Beverages Sweetened drinks, such as sweet iced tea and soda. Fats and oils Butter. Lard. Ghee. Sweets and desserts Baked goods, such as cake, cupcakes, pastries, cookies, and cheesecake. Seasoning and other foods Spice mixes with added salt. Ketchup. Barbecue sauce. Mayonnaise. Summary  To prevent diabetes from developing, you may need to make diet and other lifestyle changes to help control blood sugar, improve cholesterol levels, and manage your blood  pressure.  Set weight loss goals with the help of your health care team. It is recommended that most people with prediabetes lose 7 percent of their current body weight.  Consider following a Mediterranean diet that includes plenty of fresh fruits and vegetables, whole grains, beans, nuts, seeds, fish, lean meat, low-fat dairy, and healthy oils. This information is not intended to replace advice given to you by your health care provider. Make sure you discuss any questions you have with your health care provider. Document Revised: 12/07/2018 Document Reviewed: 10/19/2016 Elsevier Patient Education  [place]. Prediabetes Prediabetes is the condition of having a blood sugar (blood glucose) level that is higher than it should be, but not high enough for you to be diagnosed with type 2 diabetes. Having prediabetes puts you at risk for developing type 2 diabetes (type 2 diabetes mellitus). Prediabetes may be called impaired glucose tolerance or impaired fasting glucose. Prediabetes usually does not cause symptoms. Your health care provider can diagnose this condition with blood tests. You may be tested for prediabetes if you are overweight and if you have at least one other risk factor for prediabetes. What is blood glucose, and how  is it measured? Blood glucose refers to the amount of glucose in your bloodstream. Glucose comes from eating foods that contain sugars and starches (carbohydrates), which the body breaks down into glucose. Your blood glucose level may be measured in mg/dL (milligrams per deciliter) or mmol/L (millimoles per liter). Your blood glucose may be checked with one or more of the following blood tests:  A fasting blood glucose (FBG) test. You will not be allowed to eat (you will fast) for 8 hours or longer before a blood sample is taken. ? A normal range for FBG is 70-100 mg/dl (3.9-5.6 mmol/L).  An A1c (hemoglobin A1c) blood test. This test provides information about blood  glucose control over the previous 2?5month.  An oral glucose tolerance test (OGTT). This test measures your blood glucose at two times: ? After fasting. This is your baseline level. ? Two hours after you drink a beverage that contains glucose. You may be diagnosed with prediabetes:  If your FBG is 100?125 mg/dL (5.6-6.9 mmol/L).  If your A1c level is 5.7?6.4%.  If your OGTT result is 140?199 mg/dL (7.8-11 mmol/L). These blood tests may be repeated to confirm your diagnosis. How can this condition affect me? The pancreas produces a hormone (insulin) that helps to move glucose from the bloodstream into cells. When cells in the body do not respond properly to insulin that the body makes (insulin resistance), excess glucose builds up in the blood instead of going into cells. As a result, high blood glucose (hyperglycemia) can develop, which can cause many complications. Hyperglycemia is a symptom of prediabetes. Having high blood glucose for a long time is dangerous. Too much glucose in your blood can damage your nerves and blood vessels. Long-term damage can lead to complications from diabetes, which may include:  Heart disease.  Stroke.  Blindness.  Kidney disease.  Depression.  Poor circulation in the feet and legs, which could lead to surgical removal (amputation) in severe cases. What can increase my risk? Risk factors for prediabetes include:  Having a family member with type 2 diabetes.  Being overweight or obese.  Being older than age 40  Being of [place] African-American, Hispanic/Latino, or Asian/Pacific Islander descent.  Having an inactive (sedentary) lifestyle.  Having a history of heart disease.  History of gestational diabetes or polycystic ovary syndrome (PCOS), in women.  Having low levels of good cholesterol (HDL-C) or high levels of blood fats (triglycerides).  Having high blood pressure. What actions can I take to prevent diabetes?       Be physically active. ? Do moderate-intensity physical activity for 30 or more minutes on 5 or more days of the week, or as much as told by your health care provider. This could be brisk walking, biking, or water aerobics. ? Ask your health care provider what activities are safe for you. A mix of physical activities may be best, such as walking, swimming, cycling, and strength training.  Lose weight as told by your health care provider. ? Losing 5-7% of your body weight can reverse insulin resistance. ? Your health care provider can determine how much weight loss is best for you and can help you lose weight safely.  Follow a healthy meal plan. This includes eating lean proteins, complex carbohydrates, fresh fruits and vegetables, low-fat dairy products, and healthy fats. ? Follow instructions from your health care provider about eating or drinking restrictions. ? Make an appointment to see a diet and nutrition specialist (registered dietitian) to help you create a  healthy eating plan that is right for you.  Do not smoke or use any tobacco products, such as cigarettes, chewing tobacco, and e-cigarettes. If you need help quitting, ask your health care provider.  Take over-the-counter and prescription medicines as told by your health care provider. You may be prescribed medicines that help lower the risk of type 2 diabetes.  Keep all follow-up visits as told by your health care provider. This is important. Summary  Prediabetes is the condition of having a blood sugar (blood glucose) level that is higher than it should be, but not high enough for you to be diagnosed with type 2 diabetes.  Having prediabetes puts you at risk for developing type 2 diabetes (type 2 diabetes mellitus).  To help prevent type 2 diabetes, make lifestyle changes such as being physically active and eating a healthy diet. Lose weight as told by your health care provider. This information is not intended to replace  advice given to you by your health care provider. Make sure you discuss any questions you have with your health care provider. Document Revised: 12/07/2018 Document Reviewed: 10/06/2015 Elsevier Patient Education  [place]. Proteinuria Proteinuria is when there is too much protein in the urine. Proteins are important for building muscles and bones. Proteins are also needed to fight infections, help the blood to clot, and keep body fluids in balance. Proteinuria may be mild and temporary, or it may be an early sign of kidney disease. The kidneys make urine. Healthy kidneys also keep substances like proteins from leaving the blood and ending up in the urine. What are the causes? This condition may be caused by damage to the kidneys or by temporary causes such as fever or stress. Proteinuria may happen when the kidneys are not working well. Healthy kidneys have filters (glomeruli) that keep proteins out of the urine. Proteinuria may mean that the glomeruli are damaged. The main causes of this type of damage are:  Diabetes.  High blood pressure. Other causes of kidney damage can also cause proteinuria, such as:  Diseases of the immune system, such as lupus, rheumatoid arthritis, sarcoidosis, and Goodpasture syndrome.  Heart disease or heart failure.  Kidney infection.  Certain cancers, including kidney cancer, lymphoma, leukemia, and multiple myeloma.  Amyloidosis. This is a disease that causes abnormal proteins to build up in body tissues.  Reactions to certain medicines, such as NSAIDs.  Injuries or poisons (toxins).  High blood pressure that occurs during pregnancy (preeclampsia and eclampsia). Temporary proteinuria may result from conditions that put stress on the kidneys. These conditions usually do not cause kidney damage. They include:  Fever.  Exposure to cold or heat.  Emotional or physical stress.  Extreme exercise.  Standing for long periods of time. What  increases the risk? You are more likely to develop this condition if you:  Have diabetes.  Have high blood pressure.  Have heart disease or heart failure.  Have an immune disease, cancer, or other disease that affects the kidneys.  Have a family history of kidney disease.  Are 38 years of age or older.  Are overweight.  Are of African American, American [place], Hispanic/Latino, or [place] descent.  Are pregnant.  Have an infection. What are the signs or symptoms? Mild proteinuria may not cause symptoms. As more proteins enter the urine, symptoms of kidney disease may develop, such as:  Foamy urine.  Swelling of the face, abdomen, hands, legs, or feet (edema).  Needing to urinate frequently.  Fatigue.  Difficulty sleeping.  Dry and itchy skin.  Nausea and vomiting.  Muscle cramps.  Shortness of breath. How is this diagnosed? This condition may be diagnosed with a urine test. You may have this test as part of a routine physical exam or because you have symptoms of kidney disease or risk factors for kidney disease. You may also have:  Blood tests to measure the level of a certain substance (creatinine) that increases with kidney disease.  Imaging tests of your kidney, such as a CT scan or an ultrasound, to look for signs of kidney damage. How is this treated? If your proteinuria is mild or temporary, treatment may not be needed for this condition. Your health care provider may show you how to monitor the level of protein in your urine at home. Identifying proteinuria early is important so that the cause of the condition can be treated. Treatment for this condition depends on the cause of your proteinuria. Treatment may include:  Making diet and lifestyle changes.  Getting blood pressure under control.  Getting blood sugar under control, if you have diabetes.  Managing any other medical conditions you have that affect your kidneys.  Giving birth, if  you are pregnant.  Avoiding medicines that damage your kidneys. In severe cases, kidney disease may need to be treated with medicines or dialysis. Follow these instructions at home: Activity  Return to your normal activities as told by your health care provider. Ask your health care provider what activities are safe for you.  Ask your health care provider to recommend an exercise program. General instructions  Check your protein levels at home if directed by your health care provider.  Follow instructions from your health care provider about eating or drinking restrictions.  If you are overweight, ask your health care provider about diets that can help you get to a healthy weight.  Take over-the-counter and prescription medicines only as told by your health care provider.  Keep all follow-up visits as told by your health care provider. This is important. Contact a health care provider if:  You have new symptoms.  Your symptoms get worse or do not improve. Get help right away if you:  Have back pain.  Have diarrhea.  Vomit.  Have a fever.  Have a rash. Summary  Proteinuria is when there is too much protein in the urine.  Proteinuria may be mild and temporary, or it may be an early sign of kidney disease.  This condition may be diagnosed with a urine test.  Treatment for this condition depends on the cause of your proteinuria.  Treatment may include diet and lifestyle changes, blood pressure and blood sugar management, and avoiding medicines that may damage the kidneys. If the proteinuria is severe, it may need to be treated with medicines or dialysis. This information is not intended to replace advice given to you by your health care provider. Make sure you discuss any questions you have with your health care provider. Document Revised: 04/02/2018 Document Reviewed: 04/02/2018 Elsevier Patient Education  2020 [employer].  Chronic Constipation  Chronic  constipation is a condition in which a person has three or fewer bowel movements a week, for three months or longer. This condition is especially common in older adults. The two main kinds of chronic constipation are secondary constipation and functional constipation. Secondary constipation results from another condition or a treatment. Functional constipation, also called primary or idiopathic constipation, is divided into three types:  Normal transit constipation. In this type,  movement of stool through the colon (stool transit) occurs normally.  Slow transit constipation. In this type, stool moves slowly through the colon.  Outlet constipation or pelvic floor dysfunction. In this type, the nerves and muscles that empty the rectum do not work normally. What are the causes? Causes of secondary constipation may include:  Failing to drink enough fluid, eat enough food or fiber, or get physically active.  Pregnancy.  A tear in the anus (anal fissure).  Blockage in the bowel (bowel obstruction).  Narrowing of the bowel (bowel stricture).  Having a long-term medical condition, such as: ? Diabetes. ? Hypothyroidism. ? Multiple sclerosis. ? Parkinson disease. ? Stroke. ? Spinal cord injury. ? Dementia. ? Colon cancer. ? Inflammatory bowel disease (IBD). ? Iron-deficiency anemia. ? Outward collapse of the rectum (rectal prolapse). ? Hemorrhoids.  Taking certain medicines, including: ? Narcotics. These are a certain type of prescription pain medicine. ? Antacids. ? Iron supplements. ? Water pills (diuretics). ? Certain blood pressure medicines. ? Anti-seizure medicines. ? Antidepressants. ? Medicines for Parkinson disease. The cause of functional constipation is not known, but some conditions are associated with it. These conditions include:  Stress.  Problems in the nerves and muscles that control stool transit.  Weak or impaired pelvic floor muscles. What increases the  risk? You may be at higher risk for chronic constipation if you:  Are older than age 86.  Are female.  Live in a long-term care facility.  Do not get much exercise or physical activity (have a sedentary lifestyle).  Do not drink enough fluids.  Do not eat enough food, especially fiber.  Have a long-term disease.  Have a mental health disorder or eating disorder.  Take many medicines. What are the signs or symptoms? The main symptom of chronic constipation is having three or fewer bowel movements a week for several weeks. Other signs and symptoms may vary from person to person. These include:  Pushing hard (straining) to pass stool.  Painful bowel movements.  Having hard or lumpy stools.  Having lower belly discomfort, such as cramps or bloating.  Being unable to have a bowel movement when you feel the urge.  Feeling like you still need to pass stool after a bowel movement.  Feeling that you have something in your rectum that is blocking or preventing bowel movements.  Seeing blood on the toilet paper or in your stool.  Worsening confusion (in older adults). How is this diagnosed? This condition may be diagnosed based on:  Symptoms and medical history. You will be asked about your symptoms, lifestyle, diet, and any medicines that you are taking.  Physical exam. ? Your belly (abdomen) will be examined. ? A digital rectal exam may be done. For this exam, a health care provider places a lubricated, gloved finger into the rectum.  Other tests to check for any underlying causes of your constipation. These may be ordered if you have bleeding in your rectum, weight loss, or a family history of colon cancer. In these cases, you may have: ? Imaging studies of the colon. These may include X-ray, ultrasound, or CT scan. ? Blood tests. ? A procedure to examine the inside of your colon (colonoscopy). ? More specialized tests to check:  Whether your anal sphincter works well.  This is a ring-shaped muscle that controls the closing of the anus.  How well food moves through your colon. ? Tests to measure the nerve signal in your pelvic floor muscles (electromyography). How is this treated?  Treatment for chronic constipation depends on the cause. Most often, treatment starts with:  Being more active and getting regular exercise.  Drinking more fluids.  Adding fiber to your diet. Sources of fiber include fruits, vegetables, whole grains, and fiber supplements.  Using medicines such as stool softeners or medicines that increase contractions in your digestive system (pro-motility agents).  Training your pelvic muscles with biofeedback.  Surgery, if there is obstruction. Treatment for secondary chronic constipation depends on the underlying condition. You may need to:  Stop or change some medicines if they cause constipation.  Use a fiber supplement (bulk laxative) or stool softener.  Use prescription laxative. This works by [employer] into your colon (osmotic laxative). You may also need to see a specialist who treats conditions of the digestive system (gastroenterologist). Follow these instructions at home:   Take over-the-counter and prescription medicines only as told by your health care provider.  If you are taking a laxative, take it as told by your health care provider.  Eat a balanced diet that includes enough fiber. Ask your health care provider to recommend a diet that is right for you.  Drink clear fluids, especially water. Avoid drinking alcohol, caffeine, and soda.  Drink enough fluid to keep your urine pale yellow.  Get some physical activity every day. Ask your health care provider what physical activities are safe for you.  Get colon cancer screenings as told by your health care provider.  Keep all follow-up visits as told by your health care provider. This is important. Contact a health care provider if:  You are having three or  fewer bowel movements a week.  Your stools are hard or lumpy.  You notice blood on the toilet paper or in your stool after you have a bowel movement.  You have unexplained weight loss.  You have rectum (rectal) pain.  You have stool leakage.  You experience nausea or vomiting. Get help right away if:  You have rectal bleeding or you pass blood clots.  You have severe rectal pain.  You have body tissue that pushes out (protrudes) from your anus.  You have severe pain or bloating (distension) in your abdomen.  You have vomiting that you cannot control. Summary  Chronic constipation is a condition in which a person has three or fewer bowel movements a week, for three months or longer.  You may have a higher risk for this condition if you are an older adult, or if you do not drink enough water or get enough physical activity (are sedentary).  Treatment for this condition depends on the cause. Most treatments for chronic constipation include adding fiber to your diet, drinking more fluids, and getting more physical activity. You may also need to treat any underlying medical conditions or stop or change certain medicines if they cause constipation.  If lifestyle changes do not relieve constipation, your health care provider may recommend taking a laxative. This information is not intended to replace advice given to you by your health care provider. Make sure you discuss any questions you have with your health care provider. Document Revised: 07/28/2017 Document Reviewed: 05/02/2017 Elsevier Patient Education  [place].  Insomnia Insomnia is a sleep disorder that makes it difficult to fall asleep or stay asleep. Insomnia can cause fatigue, low energy, difficulty concentrating, mood swings, and poor performance at work or school. There are three different ways to classify insomnia:  Difficulty falling asleep.  Difficulty staying asleep.  Waking up too early in  the  morning. Any type of insomnia can be long-term (chronic) or short-term (acute). Both are common. Short-term insomnia usually lasts for three months or less. Chronic insomnia occurs at least three times a week for longer than three months. What are the causes? Insomnia may be caused by another condition, situation, or substance, such as:  Anxiety.  Certain medicines.  Gastroesophageal reflux disease (GERD) or other gastrointestinal conditions.  Asthma or other breathing conditions.  Restless legs syndrome, sleep apnea, or other sleep disorders.  Chronic pain.  Menopause.  Stroke.  Abuse of alcohol, tobacco, or illegal drugs.  Mental health conditions, such as depression.  Caffeine.  Neurological disorders, such as Alzheimer's disease.  An overactive thyroid (hyperthyroidism). Sometimes, the cause of insomnia may not be known. What increases the risk? Risk factors for insomnia include:  Gender. Women are affected more often than men.  Age. Insomnia is more common as you get older.  Stress.  Lack of exercise.  Irregular work schedule or working night shifts.  Traveling between different time zones.  Certain medical and mental health conditions. What are the signs or symptoms? If you have insomnia, the main symptom is having trouble falling asleep or having trouble staying asleep. This may lead to other symptoms, such as:  Feeling fatigued or having low energy.  Feeling nervous about going to sleep.  Not feeling rested in the morning.  Having trouble concentrating.  Feeling irritable, anxious, or depressed. How is this diagnosed? This condition may be diagnosed based on:  Your symptoms and medical history. Your health care provider may ask about: ? Your sleep habits. ? Any medical conditions you have. ? Your mental health.  A physical exam. How is this treated? Treatment for insomnia depends on the cause. Treatment may focus on treating an underlying  condition that is causing insomnia. Treatment may also include:  Medicines to help you sleep.  Counseling or therapy.  Lifestyle adjustments to help you sleep better. Follow these instructions at home: Eating and drinking   Limit or avoid alcohol, caffeinated beverages, and cigarettes, especially close to bedtime. These can disrupt your sleep.  Do not eat a large meal or eat spicy foods right before bedtime. This can lead to digestive discomfort that can make it hard for you to sleep. Sleep habits   Keep a sleep diary to help you and your health care provider figure out what could be causing your insomnia. Write down: ? When you sleep. ? When you wake up during the night. ? How well you sleep. ? How rested you feel the next day. ? Any side effects of medicines you are taking. ? What you eat and drink.  Make your bedroom a dark, comfortable place where it is easy to fall asleep. ? Put up shades or blackout curtains to block light from outside. ? Use a white noise machine to block noise. ? Keep the temperature cool.  Limit screen use before bedtime. This includes: ? Watching TV. ? Using your smartphone, tablet, or computer.  Stick to a routine that includes going to bed and waking up at the same times every day and night. This can help you fall asleep faster. Consider making a quiet activity, such as reading, part of your nighttime routine.  Try to avoid taking naps during the day so that you sleep better at night.  Get out of bed if you are still awake after 15 minutes of trying to sleep. Keep the lights down, but try reading or doing  a quiet activity. When you feel sleepy, go back to bed. General instructions  Take over-the-counter and prescription medicines only as told by your health care provider.  Exercise regularly, as told by your health care provider. Avoid exercise starting several hours before bedtime.  Use relaxation techniques to manage stress. Ask your health  care provider to suggest some techniques that may work well for you. These may include: ? Breathing exercises. ? Routines to release muscle tension. ? Visualizing peaceful scenes.  Make sure that you drive carefully. Avoid driving if you feel very sleepy.  Keep all follow-up visits as told by your health care provider. This is important. Contact a health care provider if:  You are tired throughout the day.  You have trouble in your daily routine due to sleepiness.  You continue to have sleep problems, or your sleep problems get worse. Get help right away if:  You have serious thoughts about hurting yourself or someone else. If you ever feel like you may hurt yourself or others, or have thoughts about taking your own life, get help right away. You can go to your nearest emergency department or call:  Your local emergency services (911 in the [place]  A suicide crisis helpline, such as the [place] at 406-502-7632. This is open 24 hours a day. Summary  Insomnia is a sleep disorder that makes it difficult to fall asleep or stay asleep.  Insomnia can be long-term (chronic) or short-term (acute).  Treatment for insomnia depends on the cause. Treatment may focus on treating an underlying condition that is causing insomnia.  Keep a sleep diary to help you and your health care provider figure out what could be causing your insomnia. This information is not intended to replace advice given to you by your health care provider. Make sure you discuss any questions you have with your health care provider. Document Revised: 07/28/2017 Document Reviewed: 05/25/2017 Elsevier Patient Education  2020 [employer]. Allergic Rhinitis, Adult Allergic rhinitis is an allergic reaction that affects the mucous membrane inside the nose. It causes sneezing, a runny or stuffy nose, and the feeling of mucus going down the back of the throat (postnasal drip). Allergic rhinitis can  be mild to severe. There are two types of allergic rhinitis:  Seasonal. This type is also called hay fever. It happens only during certain seasons.  Perennial. This type can happen at any time of the year. What are the causes? This condition happens when the body's defense system (immune system) responds to certain harmless substances called allergens as though they were germs.  Seasonal allergic rhinitis is triggered by pollen, which can come from grasses, trees, and weeds. Perennial allergic rhinitis may be caused by:  House dust mites.  Pet dander.  Mold spores. What are the signs or symptoms? Symptoms of this condition include:  Sneezing.  Runny or stuffy nose (nasal congestion).  Postnasal drip.  Itchy nose.  Tearing of the eyes.  Trouble sleeping.  Daytime sleepiness. How is this diagnosed? This condition may be diagnosed based on:  Your medical history.  A physical exam.  Tests to check for related conditions, such as: ? Asthma. ? Pink eye. ? Ear infection. ? Upper respiratory infection.  Tests to find out which allergens trigger your symptoms. These may include skin or blood tests. How is this treated? There is no cure for this condition, but treatment can help control symptoms. Treatment may include:  Taking medicines that block allergy symptoms, such as  antihistamines. Medicine may be given as a shot, nasal spray, or pill.  Avoiding the allergen.  Desensitization. This treatment involves getting ongoing shots until your body becomes less sensitive to the allergen. This treatment may be done if other treatments do not help.  If taking medicine and avoiding the allergen does not work, new, stronger medicines may be prescribed. Follow these instructions at home:  Find out what you are allergic to. Common allergens include smoke, dust, and pollen.  Avoid the things you are allergic to. These are some things you can do to help avoid  allergens: ? Replace carpet with wood, tile, or vinyl flooring. Carpet can trap dander and dust. ? Do not smoke. Do not allow smoking in your home. ? Change your heating and air conditioning filter at least once a month. ? During allergy season:  Keep windows closed as much as possible.  Plan outdoor activities when pollen counts are lowest. This is usually during the evening hours.  When coming indoors, change clothing and shower before sitting on furniture or bedding.  Take over-the-counter and prescription medicines only as told by your health care provider.  Keep all follow-up visits as told by your health care provider. This is important. Contact a health care provider if:  You have a fever.  You develop a persistent cough.  You make whistling sounds when you breathe (you wheeze).  Your symptoms interfere with your normal daily activities. Get help right away if:  You have shortness of breath. Summary  This condition can be managed by taking medicines as directed and avoiding allergens.  Contact your health care provider if you develop a persistent cough or fever.  During allergy season, keep windows closed as much as possible. This information is not intended to replace advice given to you by your health care provider. Make sure you discuss any questions you have with your health care provider. Document Revised: 07/28/2017 Document Reviewed: 09/22/2016 Elsevier Patient Education  [place] Risks of Being Overweight Maintaining a healthy body weight is an important part of your overall health. Your healthy body weight depends on your age, gender, and height. Being overweight puts you at risk for many health problems, including:  Heart disease.  Diabetes.  Problems sleeping.  Joint problems. You can make changes to your diet and lifestyle to prevent these risks. Consider working with a health care provider or a dietitian to make these  changes. What nutrition changes can be made?   Eat only as much as your body needs. In most cases, this is about 2,000 calories a day, but the amount varies depending on your height, gender, and activity level. Ask your health care provider how many calories you should have each day. Eating more than your body needs on a regular basis can cause you to become overweight or obese.  Eat slowly, and stop eating when you feel full.  Choose healthy foods, including: ? Fruits and vegetables. ? Lean meats. ? Low-fat dairy products. ? High-fiber foods, such as whole grains and beans. ? Healthy snacks like vegetable sticks, a piece of fruit, or a small amount of yogurt or cheese.  Avoid foods and drinks that are high in sugar, salt (sodium), saturated fat, or trans fat. This includes: ? Many desserts such as candy, cookies, and ice cream. ? Soda. ? Fried foods. ? Processed meats such as hot dogs or lunch meats. ? Prepackaged snack foods. What lifestyle changes can be made?   Exercise  for at least 150 minutes a week to prevent weight gain, or as often as recommended by your health care provider. Do moderate-intensity exercise, such as brisk walking. ? Spread it out by exercising for 30 minutes 5 days a week, or in short 10-minute bursts several times a day.  Find other ways to stay active and burn calories, such as yard work or a hobby that involves physical activity.  Get at least 8 hours of sleep each night. When you are well-rested, you are more likely to be active and make healthy choices during the day. To sleep better: ? Try to go to bed and wake up at about the same time every day. ? Keep your bedroom dark, quiet, and cool. ? Make sure that your bed is comfortable. ? Avoid stimulating activities, such as watching television or exercising, for at least one hour before bedtime. Why are these changes important? Eating healthy and being active helps you lose weight and prevent health  problems caused by being overweight. Making these changes can also help you manage stress, feel better mentally, and connect with friends and family. What can happen if changes are not made? Being overweight can affect you for your entire life. You may develop joint or bone problems that make it painful or difficult for you to play sports or do activities you enjoy. Being overweight puts stress on your heart and lungs and can lead to medical problems like diabetes, heart disease, and sleeping problems. Where to find support You can get support for preventing health risks of being overweight from:  Your health care provider or a dietitian. They can provide guidance about healthy eating and healthy lifestyle choices.  Weight loss support groups, online or in-person. Where to find more information  MyPlate: [employer] ? This an online tool that provides personalized recommendations about foods to eat each day.  The Centers for Disease Control and Prevention: http://sharp-hammond.biz/ ? This resource gives tips for managing weight and having an active lifestyle. Summary  To prevent unhealthy weight gain, it is important to maintain a healthy diet high in vegetables and whole grains, exercise regularly, and get at least 8 hours of sleep each night.  Making these changes helps prevent many long-term (chronic) health conditions that can shorten your life, such as diabetes, heart disease, and stroke. This information is not intended to replace advice given to you by your health care provider. Make sure you discuss any questions you have with your health care provider. Document Revised: 05/08/2019 Document Reviewed: 07/12/2017 Elsevier Patient Education  [place].  How to Perform a Sinus Rinse A sinus rinse is a home treatment that is used to rinse your sinuses with a sterile mixture of salt and water (saline solution). Sinuses are air-filled spaces in your skull behind the  bones of your face and forehead that open into your nasal cavity. A sinus rinse can help to clear mucus, dirt, dust, or pollen from your nasal cavity. You may do a sinus rinse when you have a cold, a virus, nasal allergy symptoms, a sinus infection, or stuffiness in your nose or sinuses. Talk with your health care provider about whether a sinus rinse might help you. What are the risks? A sinus rinse is generally safe and effective. However, there are a few risks, which include:  A burning sensation in your sinuses. This may happen if you do not make the saline solution as directed. Be sure to follow all directions when making the saline solution.  Nasal irritation.  Infection from contaminated water. This is rare, but possible. Do not do a sinus rinse if you have had ear or nasal surgery, ear infection, or blocked ears. Supplies needed:  Saline solution or powder.  Distilled or sterile water may be needed to mix with saline powder. ? You may use boiled and cooled tap water. Boil tap water for 5 minutes; cool until it is lukewarm. Use within 24 hours. ? Do not use regular tap water to mix with the saline solution.  Neti pot or nasal rinse bottle. These supplies release the saline solution into your nose and through your sinuses. Neti pots and nasal rinse bottles can be purchased at [employer], a health food store, or online. How to perform a sinus rinse  1. Wash your hands with soap and water. 2. Wash your device according to the directions that came with the product and then dry it. 3. Use the solution that comes with your product or one that is sold separately in stores. Follow the mixing directions on the package if you need to mix with sterile or distilled water. 4. Fill the device with the amount of saline solution noted in the device instructions. 5. Stand over a sink and tilt your head sideways over the sink. 6. Place the spout of the device in your upper nostril (the one  closer to the ceiling). 7. Gently pour or squeeze the saline solution into your nasal cavity. The liquid should drain out from the lower nostril if you are not too congested. 8. While rinsing, breathe through your open mouth. 9. Gently blow your nose to clear any mucus and rinse solution. Blowing too hard may cause ear pain. 10. Repeat in your other nostril. 11. Clean and rinse your device with clean water and then air-dry it. Talk with your health care provider or pharmacist if you have questions about how to do a sinus rinse. Summary  A sinus rinse is a home treatment that is used to rinse your sinuses with a sterile mixture of salt and water (saline solution).  A sinus rinse is generally safe and effective. Follow all instructions carefully.  Before doing a sinus rinse, talk with your health care provider about whether it would be helpful for you. This information is not intended to replace advice given to you by your health care provider. Make sure you discuss any questions you have with your health care provider. Document Revised: 06/12/2017 Document Reviewed: 06/12/2017 Elsevier Patient Education  [place].  Sinusitis, Adult Sinusitis is inflammation of your sinuses. Sinuses are hollow spaces in the bones around your face. Your sinuses are located:  Around your eyes.  In the middle of your forehead.  Behind your nose.  In your cheekbones. Mucus normally drains out of your sinuses. When your nasal tissues become inflamed or swollen, mucus can become trapped or blocked. This allows bacteria, viruses, and fungi to grow, which leads to infection. Most infections of the sinuses are caused by a virus. Sinusitis can develop quickly. It can last for up to 4 weeks (acute) or for more than 12 weeks (chronic). Sinusitis often develops after a cold. What are the causes? This condition is caused by anything that creates swelling in the sinuses or stops mucus from draining. This  includes:  Allergies.  Asthma.  Infection from bacteria or viruses.  Deformities or blockages in your nose or sinuses.  Abnormal growths in the nose (nasal polyps).  Pollutants, such as chemicals or  irritants in the air.  Infection from fungi (rare). What increases the risk? You are more likely to develop this condition if you:  Have a weak body defense system (immune system).  Do a lot of swimming or diving.  Overuse nasal sprays.  Smoke. What are the signs or symptoms? The main symptoms of this condition are pain and a feeling of pressure around the affected sinuses. Other symptoms include:  Stuffy nose or congestion.  Thick drainage from your nose.  Swelling and warmth over the affected sinuses.  Headache.  Upper toothache.  A cough that may get worse at night.  Extra mucus that collects in the throat or the back of the nose (postnasal drip).  Decreased sense of smell and taste.  Fatigue.  A fever.  Sore throat.  Bad breath. How is this diagnosed? This condition is diagnosed based on:  Your symptoms.  Your medical history.  A physical exam.  Tests to find out if your condition is acute or chronic. This may include: ? Checking your nose for nasal polyps. ? Viewing your sinuses using a device that has a light (endoscope). ? Testing for allergies or bacteria. ? Imaging tests, such as an MRI or CT scan. In rare cases, a bone biopsy may be done to rule out more serious types of fungal sinus disease. How is this treated? Treatment for sinusitis depends on the cause and whether your condition is chronic or acute.  If caused by a virus, your symptoms should go away on their own within 10 days. You may be given medicines to relieve symptoms. They include: ? Medicines that shrink swollen nasal passages (topical intranasal decongestants). ? Medicines that treat allergies (antihistamines). ? A spray that eases inflammation of the nostrils (topical  intranasal corticosteroids). ? Rinses that help get rid of thick mucus in your nose (nasal saline washes).  If caused by bacteria, your health care provider may recommend waiting to see if your symptoms improve. Most bacterial infections will get better without antibiotic medicine. You may be given antibiotics if you have: ? A severe infection. ? A weak immune system.  If caused by narrow nasal passages or nasal polyps, you may need to have surgery. Follow these instructions at home: Medicines  Take, use, or apply over-the-counter and prescription medicines only as told by your health care provider. These may include nasal sprays.  If you were prescribed an antibiotic medicine, take it as told by your health care provider. Do not stop taking the antibiotic even if you start to feel better. Hydrate and humidify   Drink enough fluid to keep your urine pale yellow. Staying hydrated will help to thin your mucus.  Use a cool mist humidifier to keep the humidity level in your home above 50%.  Inhale steam for 10-15 minutes, 3-4 times a day, or as told by your health care provider. You can do this in the bathroom while a hot shower is running.  Limit your exposure to cool or dry air. Rest  Rest as much as possible.  Sleep with your head raised (elevated).  Make sure you get enough sleep each night. General instructions   Apply a warm, moist washcloth to your face 3-4 times a day or as told by your health care provider. This will help with discomfort.  Wash your hands often with soap and water to reduce your exposure to germs. If soap and water are not available, use hand sanitizer.  Do not smoke. Avoid being around people  who are smoking (secondhand smoke).  Keep all follow-up visits as told by your health care provider. This is important. Contact a health care provider if:  You have a fever.  Your symptoms get worse.  Your symptoms do not improve within 10 days. Get help  right away if:  You have a severe headache.  You have persistent vomiting.  You have severe pain or swelling around your face or eyes.  You have vision problems.  You develop confusion.  Your neck is stiff.  You have trouble breathing. Summary  Sinusitis is soreness and inflammation of your sinuses. Sinuses are hollow spaces in the bones around your face.  This condition is caused by nasal tissues that become inflamed or swollen. The swelling traps or blocks the flow of mucus. This allows bacteria, viruses, and fungi to grow, which leads to infection.  If you were prescribed an antibiotic medicine, take it as told by your health care provider. Do not stop taking the antibiotic even if you start to feel better.  Keep all follow-up visits as told by your health care provider. This is important. This information is not intended to replace advice given to you by your health care provider. Make sure you discuss any questions you have with your health care provider. Document Revised: 01/15/2018 Document Reviewed: 01/15/2018 Elsevier Patient Education  [place].

## 2019-10-09 NOTE — Progress Notes (Signed)
Subjective:    Patient ID: April Griffith, female    DOB: [birth year], 39 y.o.   MRN: [id number]  38y/o caucasian female here for annual phsyical. Had covid 07/29/2019 still not back to her regular endurance at park yesterday with the kids and wanted to walk more but fatigued. still feeling short of breath, easily fatigued with activity s/p covid   Patient gained 30 lbs in the past year would like dietary referral to help with weight loss/nutrition has lost 11 lbs using [employer] shakes and bars decreased her bloating and jump started weight loss but wants to learn how to eat better and lose weight. She also reported chronic constipation  She thinks her problem not eating enough calories; last weight on file with Dr Despina Arias 140.8lbs 12/21/2017 today 163lbs. She needs propranolol 80mg  po daily refilled for familial tremor working well for her (previously had been on metoprolol but dropping her blood pressure too much propranolol not having hypotension problem;  medication combo cymbalta,  hydrocodone and tramadol working well for her Dr in [place] for low back pain chronic.  Denied loss of bowel/bladder control, saddle paresthesias or arm/leg weakness.  Required repeat surgery lumbar to replace hardware and added level L3-L5; 2007 lumbar and 2006 surgeries/spinal fusion.  2011 plates and screws removed; 2020 xrays noted fusion L4-S1 anterolithesis L5-S1 and slide retrolisthesis L3-5 PLIF with solid arthrodesis no impingement.  Migraines unchanged since age 89 maxalt prn.  Seasonal allergies using flonase and singulair needs another bottle saline nasal ongoing sinus infection 2 appts with [facility] and no improvement in symptoms; has used augmentin in past for sinus infection but needed diflucan 1 tab for yeast infection after augmentin. Has done telehealth and [facility] visit for sinusitis failed steroids and nose spray; pressure behind eyes/nose and ears popping/pain;  denied teeth hurting/fever/chills/n/v/d.     Prozac working well for anxiety/depression. Denied HI/SI.  GYN appt pending next month for PAP; last mammogram 2018 left breast biopsy 2012     Review of Systems  Constitutional: Positive for fatigue. Negative for activity change, appetite change, chills, diaphoresis, fever and unexpected weight change.  HENT: Positive for congestion, ear pain, postnasal drip, sinus pressure, sinus pain and sore throat. Negative for dental problem, drooling, ear discharge, facial swelling, hearing loss, mouth sores, nosebleeds, rhinorrhea, sneezing, tinnitus, trouble swallowing and voice change.   Eyes: Negative for photophobia, pain, discharge, redness, itching and visual disturbance.  Respiratory: Positive for shortness of breath. Negative for cough, choking, chest tightness, wheezing and stridor.   Cardiovascular: Negative for chest pain, palpitations and leg swelling.  Gastrointestinal: Positive for constipation. Negative for abdominal distention, abdominal pain, blood in stool, diarrhea, nausea and vomiting.  Endocrine: Negative for cold intolerance and heat intolerance.  Genitourinary: Negative for difficulty urinating, dysuria and hematuria.  Musculoskeletal: Positive for back pain. Negative for arthralgias, gait problem, joint swelling, myalgias, neck pain and neck stiffness.  Skin: Negative for color change, pallor, rash and wound.  Allergic/Immunologic: Positive for environmental allergies. Negative for food allergies.  Neurological: Negative for dizziness, tremors, seizures, syncope, facial asymmetry, speech difficulty, weakness, light-headedness, numbness and headaches.  Hematological: Negative for adenopathy. Does not bruise/bleed easily.  Psychiatric/Behavioral: Negative for agitation, behavioral problems, confusion and sleep disturbance.       Objective:   Physical Exam Vitals and nursing note reviewed.  Constitutional:      General: She is awake. She is not in acute distress.     Appearance: Normal appearance. She is well-developed, well-groomed and overweight.  She is not ill-appearing, toxic-appearing or diaphoretic.  HENT:     Head: Normocephalic and atraumatic.     Jaw: There is normal jaw occlusion. No trismus.     Salivary Glands: Right salivary gland is not diffusely enlarged or tender. Left salivary gland is not diffusely enlarged or tender.     Right Ear: Hearing, ear canal and external ear normal. A middle ear effusion is present.     Left Ear: Hearing, ear canal and external ear normal. A middle ear effusion is present.     Nose: Mucosal edema present. No nasal deformity, septal deviation, laceration, congestion or rhinorrhea.     Right Sinus: Maxillary sinus tenderness present. No frontal sinus tenderness.     Left Sinus: Maxillary sinus tenderness present. No frontal sinus tenderness.     Mouth/Throat:     Lips: Pink. No lesions.     Mouth: Mucous membranes are moist. Mucous membranes are not pale, not dry and not cyanotic. No injury, lacerations, oral lesions or angioedema.     Dentition: No gingival swelling, dental caries, dental abscesses or gum lesions.     Tongue: No lesions. Tongue does not deviate from midline.     Palate: No mass and lesions.     Pharynx: Uvula midline. Pharyngeal swelling and posterior oropharyngeal erythema present. No oropharyngeal exudate or uvula swelling.     Tonsils: No tonsillar exudate or tonsillar abscesses.     Comments: Bilateral allergic shiners; cobblestoning posterior pharynx; bilateral TMs with air fluid level clear Eyes:     General: Lids are normal. Vision grossly intact. Gaze aligned appropriately. Allergic shiner present. No visual field deficit or scleral icterus.       Right eye: No foreign body, discharge or hordeolum.        Left eye: No foreign body, discharge or hordeolum.     Extraocular Movements:     Right eye: Normal extraocular motion and no nystagmus.     Left eye: Normal extraocular motion and no  nystagmus.     Conjunctiva/sclera: Conjunctivae normal.     Right eye: Right conjunctiva is not injected. No chemosis, exudate or hemorrhage.    Left eye: Left conjunctiva is not injected. No chemosis, exudate or hemorrhage.    Pupils: Pupils are equal, round, and reactive to light. Pupils are equal.     Right eye: Pupil is round and reactive.     Left eye: Pupil is round and reactive.  Neck:     Thyroid: No thyroid mass, thyromegaly or thyroid tenderness.     Trachea: Trachea and phonation normal. No tracheal tenderness or tracheal deviation.  Cardiovascular:     Rate and Rhythm: Normal rate and regular rhythm.     Chest Wall: PMI is not displaced.     Pulses:          Radial pulses are 2+ on the right side and 2+ on the left side.     Heart sounds: Normal heart sounds, S1 normal and S2 normal. No murmur. No friction rub. No gallop.   Pulmonary:     Effort: Pulmonary effort is normal. No accessory muscle usage or respiratory distress.     Breath sounds: Normal breath sounds and air entry. No stridor, decreased air movement or transmitted upper airway sounds. No decreased breath sounds, wheezing, rhonchi or rales.     Comments: Breath sounds course middle lobes no cough observed in exam room; spoke full sentences without difficulty sp02 100% while talking and sitting in  exam room; wearing cloth mask due to covid 19 pandemic Chest:     Chest wall: No tenderness.  Abdominal:     General: Abdomen is flat. Bowel sounds are normal. There is no distension.     Palpations: Abdomen is soft. There is no shifting dullness, fluid wave, hepatomegaly, splenomegaly, mass or pulsatile mass.     Tenderness: There is no abdominal tenderness. There is no right CVA tenderness, left CVA tenderness, guarding or rebound. Negative signs include Murphy's sign.     Hernia: No hernia is present. There is no hernia in the umbilical area or ventral area.     Comments: Dull to percussion x4 quads; normoactive bowel  sounds x 4 quads  Musculoskeletal:        General: No swelling, tenderness, deformity or signs of injury.     Right shoulder: Normal.     Left shoulder: Normal.     Right elbow: Normal.     Left elbow: Normal.     Right wrist: Normal.     Left wrist: Normal.     Right hand: Normal.     Left hand: Normal.     Cervical back: Normal, normal range of motion and neck supple. No swelling, edema, deformity, erythema, signs of trauma, lacerations, rigidity, spasms, torticollis, tenderness, bony tenderness or crepitus. No pain with movement, spinous process tenderness or muscular tenderness. Normal range of motion.     Thoracic back: Normal.     Lumbar back: No swelling, edema, deformity, signs of trauma, lacerations, spasms, tenderness or bony tenderness. Decreased range of motion.     Right hip: Normal.     Left hip: Normal.     Right knee: Normal.     Left knee: Normal.     Right lower leg: No edema.     Left lower leg: No edema.  Lymphadenopathy:     Head:     Right side of head: No submental, submandibular, tonsillar, preauricular, posterior auricular or occipital adenopathy.     Left side of head: No submental, submandibular, tonsillar, preauricular, posterior auricular or occipital adenopathy.     Cervical: No cervical adenopathy.     Right cervical: No superficial, deep or posterior cervical adenopathy.    Left cervical: No superficial, deep or posterior cervical adenopathy.  Skin:    General: Skin is warm and dry.     Capillary Refill: Capillary refill takes less than 2 seconds.     Coloration: Skin is not ashen, cyanotic, jaundiced, mottled, pale or sallow.     Findings: No abrasion, abscess, acne, bruising, burn, ecchymosis, erythema, signs of injury, laceration, lesion, petechiae, rash or wound.     Nails: There is no clubbing.  Neurological:     General: No focal deficit present.     Mental Status: She is alert and oriented to person, place, and time. Mental status is at  baseline. She is not disoriented.     GCS: GCS eye subscore is 4. GCS verbal subscore is 5. GCS motor subscore is 6.     Cranial Nerves: Cranial nerves are intact. No cranial nerve deficit, dysarthria or facial asymmetry.     Sensory: Sensation is intact. No sensory deficit.     Motor: Motor function is intact. No weakness, tremor, atrophy, abnormal muscle tone or seizure activity.     Coordination: Coordination is intact. Coordination normal.     Gait: Gait is intact. Gait normal.     Comments: Gait sure and steady in hallway; on/off  exam table and in/out of chair without difficulty; bilateral hand grasp and upper/lower extremity strength equal  Psychiatric:        Attention and Perception: Attention and perception normal.        Mood and Affect: Mood and affect normal.        Speech: Speech normal.        Behavior: Behavior normal. Behavior is cooperative.        Thought Content: Thought content normal.        Cognition and Memory: Cognition and memory normal.        Judgment: Judgment normal.     Comments: GAD 7 Over the past two weeks, how often have you been bothered by the following problems:   not at all 0; several days 1; more than half the days 2; nearly every day 3  1. Feeling nervous, anxious or on edge 0 2. Not being able to stop or control worrying0 3. Worrying too much about different things0 4. Trouble relaxing0 5. Being so restlness that it is hard to sit still0 6. Becoming easily annoyed or irritable0 7. Feeling afraid as if something awful might happen0 Score  0   /21 If you checked off any problems, how difficult have these problems made it for you to do your work, take care of things at home, or get along with other peope? Not at all  Score 5 to 9 mild; 10 to 14 moderate; 15 to 21 severe anxiety  PHQ-9   In the past two weeks how often have you been bothered by these symptoms/problems:  Not at all 0; several days 1; more than half the days 2; nearly every day  3  1.Little interest or pleasure in doing things 0 2. Feeling down,depressed or hopeless0 3. Trouble falling or staying asleep or sleeping too much3 4. Feeling tired or having little energy3 5. Poor appetite or overeating0 6. Feeling bad about yourself or that you are a failure or have let yourself or your family Down 0 7. Trouble concentrating on things, such as reading the newspaper or watching TV  0 8. Moving or speaking so slowly that other people could have noticed? Or the opposite, being so fidgety or restless that you have been moving around a lot more than usual 0 9. Thoughts that you would be better off dead or hurting yourself in some way 0 Score:    6 /27 mild 5 to 9 mild; 10 to 14 moderate; 15 to 19 moderately severe; >20 severe depression  If you checked off any problems, how difficult have these problems made it for you to do your work, take care of things at home, or get along with other peope? Not at all    Maxillary sinuses TTP bilaterally and air fluid level bilateral TMs cobblestoning posterior pharynx and bilateral allergic shiners  recent signs URI/sinus/ear infection slightly elevated WBC and neutrophils and post covid Nov 2020.  Noted patient had trace/15+ protein urinalysis on previous physicals in 2016,2017 and 2019 per chart review paper at [facility]. Stable. Avoid OTC medications without first consulting provider/pharmacist and avoid dehydration/drink water to keep urine pale yellow and clear and voiding every 4 hours while awake. Blood sugar, phosphorus, LDL and WBC/neutrophils elevated and LDH low (not present on 2019 labs). Hgba1C normal at 5.4 continue to monitor fasting glucose/HgbA1c annually if fasting glucose elevated.  Patient had recently finished prednisone oral course and along with covid/sinusitis could have driven up blood sugars for  this annual exam testing.    Reviewed current lab results with patient and paper copy of labs given to her  today with instructions Discussed elevated cholesterol, blood sugar, WBC/neutrophils. Continue to monitor phosphorus annually.  Given proteinuria, prediabetes, cholesterol handouts at appt. I recommend weight loss, exercise 150 minutes per week; dietary fiber daily by mouth 20 grams women per up to date; eat whole grains/fruits/vegetables; keep added sugars to less than 100 calories/ 5 teaspoons for women per American [employer] Association; electrolytes, iron, serum kidney/liver function, and thyroid normal No anemia  Weight 140lb 2019 physical     Assessment & Plan:  A-annual physical without gynecological, insomnia, migraines, hypertension, essential tremor, allergic rhinitis, chronic pain lumbar, anxiety/depression, BMI 27.9, persistent proteinuria, hyperlipidemia LDL  P- Continue appts with pain management for chronic lumbar pain/medication management.  I reviewed last office visit note [facility] 16 Sep 2019 in paper outpatient record.   Refilled propranolol 80mg  po daily for familial tremors working well no tremor noted in clinic today. BP stable 110/78 today acute pain/sinusitis.   Ambulatory referral to nutrition [facility]  BMI 27 weight loss, activity as tolerated due to chronic pain steps/stretching/post covid need to pace increased activity.  Consider getting covid vaccination when her risk group available probably next month as immunity will be waning as 90 days post infection end of this month. LDL 113 otherwise lipids normal going to try dietary changes/increase activity repeat lipids in 12 months  Proteinuria chronic not worsening on chronic pain medications but also s/p covid; serum renal function WNL will continue to monitor annual consider microalbumin.  Discussed with patient if gross hematuria, changes in urinary color unexpected e.g. tea/cola colored/cloudy or dysuria follow up for re-evaluation.   Exitcare handouts printed and given to patient  on preventing  health effects overweight, hypertension, high cholesterol, prediabetes, insomnia, constipation.  Patient will call pharmacy to have them notify when refills required on chronic medications.  Only propranolol needed today.  Patient verbalized understanding information/instructions, agreed with plan of care and had no further questions at this time.    augmentin diflucan rx and bottle of saline from clinic stock sinusitis maxillary  Continue flonase 1 spray each nostril BID, saline 2 sprays each nostril q2h wa prn congestion.  If no improvement with 48 hours of saline and flonase use start augmentin 875mg  po BID x 10 days #20 RF0 electronic Rx to her pharmacy of choice.  History of vaginal yeast after augmentin use diflucan 150mg  po x 1 #1 RF0 electronic rx to her pharmacy of choice  Denied personal or family history of ENT cancer.  Shower BID especially prior to bed. No evidence of systemic bacterial infection, non toxic and well hydrated.  I do not see where any further testing or imaging is necessary at this time.   I will suggest supportive care, rest, good hygiene and encourage the patient to take adequate fluids.  The patient is to return to clinic or EMERGENCY ROOM if symptoms worsen or change significantly.  exitcare handouts on sinusitis and sinus rinse printed and given to patient.  Patient verbalized agreement and understanding of treatment plan and had no further questions at this time.   P2:  Hand washing and cover cough  Patient may use normal saline nasal spray 2 sprays each nostril q2h wa as needed. flonase [place] 1 spray each nostril BID  Patient denied personal or family history of ENT cancer.  Avoid triggers if possible.  Shower  prior to bedtime if exposed to triggers.  If allergic dust/dust mites recommend mattress/pillow covers/encasements; washing linens, vacuuming, sweeping, dusting weekly.  Call or return to clinic as needed if these symptoms worsen or fail to improve as anticipated.    Exitcare handout on allergic rhinitis and sinus rinse given to patient.  Patient verbalized understanding of instructions, agreed with plan of care and had no further questions at this time.  P2:  Avoidance and hand washing.  Breast cancer screening overdue; patient to discuss with GYN at appt already scheduled Mar 2021 as typically clinical breast exam performed at GYN exam also.  Patient to notify me if GYN provider unwilling to put in mammogram order and I will do so for her.  Patient verbalized understanding information/instructions, agreed with plan of care and had no further questions at this time.  Insomnia due to chronic pain using ambien trazodone with good results able to get good sleep with medication use.  migraines maxalt prn  Unchanged frequency/intensity since age 3 per patient.  Follow up if new or worsening symptoms with migraines e.g. worst headache of life, visual changes.  Patient verbalized understanding information/instructions, agreed with plan of care and had no further questions at this time.  Anxiety and depression scores low PHQ-9 GAD7 mild; prozac working well for her continue follow up annually and prn if new or worsening symptoms.  Patient verbalized understanding information/instructions, agreed with plan of care and had no further questions at this time.  Rosacea not currently flaring no concerns at this time.  Lichen planopilaris no concerns at this time.  GERD stable protonix 20mg  po daily continue  Weight loss may also improved GERD symptoms.

## 2019-10-10 ENCOUNTER — Encounter: Payer: Self-pay | Admitting: Registered Nurse

## 2019-10-10 MED ORDER — FLUCONAZOLE 150 MG PO TABS: 150.0000 mg | ORAL_TABLET | Freq: Once | ORAL | 0 refills | Status: AC

## 2019-10-10 MED ORDER — PROPRANOLOL HCL 80 MG PO TABS: 80.0000 mg | ORAL_TABLET | Freq: Every day | ORAL | 3 refills | Status: DC

## 2019-10-28 ENCOUNTER — Encounter: Payer: Self-pay | Admitting: Certified Nurse Midwife

## 2019-11-07 ENCOUNTER — Ambulatory Visit: Payer: 59 | Admitting: Skilled Nursing Facility1

## 2019-11-13 ENCOUNTER — Telehealth: Payer: Self-pay

## 2019-11-13 NOTE — Telephone Encounter (Signed)
Office notes from [facility] dated 11/13/19 from Michael Litter, MSPAS, PA-C.  Reviewed by Albina Billet, NP-C (Interim Provider) 11/13/19. "Reviewed 3/3 pages on 11/13/19.  Patient received refill hydrocodone/acetaminophen 10/325 mg po qid prn pain & using Tramadol when having to work onsite."  AMD

## 2019-11-14 ENCOUNTER — Ambulatory Visit: Payer: 59 | Admitting: Skilled Nursing Facility1

## 2019-11-25 ENCOUNTER — Other Ambulatory Visit: Payer: Self-pay | Admitting: Registered Nurse

## 2019-11-25 DIAGNOSIS — G43919 Migraine, unspecified, intractable, without status migrainosus: Secondary | ICD-10-CM

## 2019-11-25 DIAGNOSIS — G4700 Insomnia, unspecified: Secondary | ICD-10-CM

## 2019-11-25 NOTE — Telephone Encounter (Signed)
[facility] pt. Thanks!

## 2019-12-12 ENCOUNTER — Telehealth: Payer: Self-pay

## 2019-12-12 NOTE — Telephone Encounter (Signed)
Received office notes from [facility] dated 12/11/19.  Notes reviewed by Albina Billet, NP-C on 12/11/19:  "Reviewed 3/3 pages on 12/11/19.  Condition stable & pain management refilled her medications.  UDS as expected."  AMD

## 2019-12-16 ENCOUNTER — Other Ambulatory Visit: Payer: Self-pay

## 2019-12-16 ENCOUNTER — Encounter: Payer: Self-pay | Admitting: Certified Nurse Midwife

## 2019-12-16 ENCOUNTER — Ambulatory Visit (INDEPENDENT_AMBULATORY_CARE_PROVIDER_SITE_OTHER): Payer: 59 | Admitting: Certified Nurse Midwife

## 2019-12-16 ENCOUNTER — Other Ambulatory Visit (HOSPITAL_COMMUNITY)
Admission: RE | Admit: 2019-12-16 | Discharge: 2019-12-16 | Disposition: A | Discharge status: Home / Self Care | Payer: 59 | Source: Ambulatory Visit | Attending: Certified Nurse Midwife | Admitting: Certified Nurse Midwife

## 2019-12-16 VITALS — BP 109/85 | HR 80 | Ht 64.0 in | Wt 164.4 lb

## 2019-12-16 DIAGNOSIS — Z1231 Encounter for screening mammogram for malignant neoplasm of breast: Secondary | ICD-10-CM | POA: Diagnosis not present

## 2019-12-16 DIAGNOSIS — Z01419 Encounter for gynecological examination (general) (routine) without abnormal findings: Secondary | ICD-10-CM | POA: Insufficient documentation

## 2019-12-16 DIAGNOSIS — Z124 Encounter for screening for malignant neoplasm of cervix: Secondary | ICD-10-CM

## 2019-12-16 NOTE — Patient Instructions (Signed)
Preventive Care 40-40 Years Old, Female Preventive care refers to visits with your health care provider and lifestyle choices that can promote health and wellness. This includes:  A yearly physical exam. This may also be called an annual well check.  Regular dental visits and eye exams.  Immunizations.  Screening for certain conditions.  Healthy lifestyle choices, such as eating a healthy diet, getting regular exercise, not using drugs or products that contain nicotine and tobacco, and limiting alcohol use. What can I expect for my preventive care visit? Physical exam Your health care provider will check your:  Height and weight. This may be used to calculate body mass index (BMI), which tells if you are at a healthy weight.  Heart rate and blood pressure.  Skin for abnormal spots. Counseling Your health care provider may ask you questions about your:  Alcohol, tobacco, and drug use.  Emotional well-being.  Home and relationship well-being.  Sexual activity.  Eating habits.  Work and work environment.  Method of birth control.  Menstrual cycle.  Pregnancy history. What immunizations do I need?  Influenza (flu) vaccine  This is recommended every year. Tetanus, diphtheria, and pertussis (Tdap) vaccine  You may need a Td booster every 10 years. Varicella (chickenpox) vaccine  You may need this if you have not been vaccinated. Human papillomavirus (HPV) vaccine  If recommended by your health care provider, you may need three doses over 6 months. Measles, mumps, and rubella (MMR) vaccine  You may need at least one dose of MMR. You may also need a second dose. Meningococcal conjugate (MenACWY) vaccine  One dose is recommended if you are age 19-21 years and a first-year college student living in a residence hall, or if you have one of several medical conditions. You may also need additional booster doses. Pneumococcal conjugate (PCV13) vaccine  You may need  this if you have certain conditions and were not previously vaccinated. Pneumococcal polysaccharide (PPSV23) vaccine  You may need one or two doses if you smoke cigarettes or if you have certain conditions. Hepatitis A vaccine  You may need this if you have certain conditions or if you travel or work in places where you may be exposed to hepatitis A. Hepatitis B vaccine  You may need this if you have certain conditions or if you travel or work in places where you may be exposed to hepatitis B. Haemophilus influenzae type b (Hib) vaccine  You may need this if you have certain conditions. You may receive vaccines as individual doses or as more than one vaccine together in one shot (combination vaccines). Talk with your health care provider about the risks and benefits of combination vaccines. What tests do I need?  Blood tests  Lipid and cholesterol levels. These may be checked every 5 years starting at age 20.  Hepatitis C test.  Hepatitis B test. Screening  Diabetes screening. This is done by checking your blood sugar (glucose) after you have not eaten for a while (fasting).  Sexually transmitted disease (STD) testing.  BRCA-related cancer screening. This may be done if you have a family history of breast, ovarian, tubal, or peritoneal cancers.  Pelvic exam and Pap test. This may be done every 3 years starting at age 21. Starting at age 30, this may be done every 5 years if you have a Pap test in combination with an HPV test. Talk with your health care provider about your test results, treatment options, and if necessary, the need for more tests.   Follow these instructions at home: Eating and drinking   Eat a diet that includes fresh fruits and vegetables, whole grains, lean protein, and low-fat dairy.  Take vitamin and mineral supplements as recommended by your health care provider.  Do not drink alcohol if: ? Your health care provider tells you not to drink. ? You are  pregnant, may be pregnant, or are planning to become pregnant.  If you drink alcohol: ? Limit how much you have to 0-1 drink a day. ? Be aware of how much alcohol is in your drink. In the [place] one drink equals one 12 oz bottle of beer (355 mL), one 5 oz glass of wine (148 mL), or one 1 oz glass of hard liquor (44 mL). Lifestyle  Take daily care of your teeth and gums.  Stay active. Exercise for at least 30 minutes on 5 or more days each week.  Do not use any products that contain nicotine or tobacco, such as cigarettes, e-cigarettes, and chewing tobacco. If you need help quitting, ask your health care provider.  If you are sexually active, practice safe sex. Use a condom or other form of birth control (contraception) in order to prevent pregnancy and STIs (sexually transmitted infections). If you plan to become pregnant, see your health care provider for a preconception visit. What's next?  Visit your health care provider once a year for a well check visit.  Ask your health care provider how often you should have your eyes and teeth checked.  Stay up to date on all vaccines. This information is not intended to replace advice given to you by your health care provider. Make sure you discuss any questions you have with your health care provider. Document Revised: 04/26/2018 Document Reviewed: 04/26/2018 Elsevier Patient Education  2020 [employer].

## 2019-12-16 NOTE — Progress Notes (Signed)
GYNECOLOGY ANNUAL PREVENTATIVE CARE ENCOUNTER NOTE  History:     April Griffith is a 39 y.o. No obstetric history on file. female here for a routine annual gynecologic exam.  Current complaints: weight gain with staying at home during [place]. Due to diet and decrease activity.    Denies abnormal vaginal bleeding, discharge, pelvic pain, problems with intercourse or other gynecologic concerns.     Social  Relationship felmale partner (devoreded) Living: with daughter, step son , and her partner Work: Materials engineer Exercise: not currently, has trainer and is starting soon Drinking/Alcohol/Drugs:none  Gynecologic History No LMP recorded. Contraception: none Last Pap: 2013. Results were: normal per pt, but has hx abnormal  last mammogram: 11/30/2016. Results were: normal  Obstetric History OB History  No obstetric history on file.    No past medical history on file.  Past Surgical History:  Procedure Laterality Date  . BREAST BIOPSY Left 06/13/2011   benign done at Dr. Dwyane Luo office  . CESAREAN SECTION  2010  . SPINAL FUSION N/A    L4 - S1 (Has had spinal fusion surgery 3 times)    Current Outpatient Medications on File Prior to Visit  Medication Sig Dispense Refill  . rizatriptan (MAXALT) 5 MG tablet TAKE ONE TABLET BY MOUTH EVERY DAY AS NEEDED FOR MIGRAINE 30 tablet 2  . traZODone (DESYREL) 50 MG tablet TAKE 1 TO 2 TABLETS AT BEDTIME AS NEEDED 60 tablet 1  . amoxicillin-clavulanate (AUGMENTIN) 875-125 MG tablet Take 1 tablet by mouth 2 (two) times daily. 20 tablet 0  . DULoxetine (CYMBALTA) 30 MG capsule Take 1 capsule (30 mg total) by mouth daily. 90 capsule 3  . HYDROcodone-acetaminophen (NORCO) 10-325 MG tablet     . montelukast (SINGULAIR) 10 MG tablet     . ondansetron (ZOFRAN-ODT) 4 MG disintegrating tablet Take 4 mg by mouth every 8 (eight) hours as needed.    . pantoprazole (PROTONIX) 20 MG tablet Take 1 tablet (20 mg total)  by mouth daily. 30 tablet 2  . propranolol (INDERAL) 80 MG tablet Take 1 tablet (80 mg total) by mouth daily. 90 tablet 3  . sodium chloride (OCEAN) 0.65 % SOLN nasal spray Place 2 sprays into both nostrils every 2 (two) hours while awake.  0   No current facility-administered medications on file prior to visit.    Allergies  Allergen Reactions  . Tape Rash  . Cefixime Hives    Social History:  reports that she has never smoked. She has never used smokeless tobacco.  No family history on file.  The following portions of the patient's history were reviewed and updated as appropriate: allergies, current medications, past family history, past medical history, past social history, past surgical history and problem list.  Review of Systems Pertinent items noted in HPI and remainder of comprehensive ROS otherwise negative.  Physical Exam:  There were no vitals taken for this visit. CONSTITUTIONAL: Well-developed, well-nourished female in no acute distress.  HENT:  Normocephalic, atraumatic, External right and left ear normal. Oropharynx is clear and moist EYES: Conjunctivae and EOM are normal. Pupils are equal, round, and reactive to light. No scleral icterus.  NECK: Normal range of motion, supple, no masses.  Normal thyroid.  SKIN: Skin is warm and dry. No rash noted. Not diaphoretic. No erythema. No pallor. MUSCULOSKELETAL: Normal range of motion. No tenderness.  No cyanosis, clubbing, or edema.  2+ distal pulses. NEUROLOGIC: Alert and oriented to person, place, and time.  Normal reflexes, muscle tone coordination.  PSYCHIATRIC: Normal mood and affect. Normal behavior. Normal judgment and thought content. CARDIOVASCULAR: Normal heart rate noted, regular rhythm RESPIRATORY: Clear to auscultation bilaterally. Effort and breath sounds normal, no problems with respiration noted. BREASTS: Symmetric in size. No masses, tenderness, skin changes, nipple drainage, or lymphadenopathy bilaterally.  Performed in the presence of a chaperone. ABDOMEN: Soft, no distention noted.  No tenderness, rebound or guarding.  PELVIC: Normal appearing external genitalia and urethral meatus; normal appearing vaginal mucosa and cervix.  No abnormal discharge noted.  Pap smear obtained. Contact bleeding present Normal uterine size, no other palpable masses, no uterine or adnexal tenderness.  Performed in the presence of a chaperone.   Assessment and Plan:    1. Women's annual routine gynecological examination Will follow up results of pap smear and manage accordingly. Mammogram scheduled Labs: none Refills: none Referrals:none Routine preventative health maintenance measures emphasized. Please refer to After Visit Summary for other counseling recommendations.     Doreene Burke, CNM

## 2019-12-19 LAB — CYTOLOGY - PAP
Comment: NEGATIVE
Diagnosis: NEGATIVE
High risk HPV: NEGATIVE

## 2019-12-20 ENCOUNTER — Emergency Department: Payer: 59

## 2019-12-20 ENCOUNTER — Other Ambulatory Visit: Payer: Self-pay

## 2019-12-20 ENCOUNTER — Emergency Department
Admission: EM | Admit: 2019-12-20 | Discharge: 2019-12-20 | Disposition: A | Discharge status: Home / Self Care | Payer: 59 | Attending: Emergency Medicine | Admitting: Emergency Medicine

## 2019-12-20 ENCOUNTER — Encounter: Payer: Self-pay | Admitting: Emergency Medicine

## 2019-12-20 DIAGNOSIS — M542 Cervicalgia: Secondary | ICD-10-CM | POA: Insufficient documentation

## 2019-12-20 DIAGNOSIS — Z79899 Other long term (current) drug therapy: Secondary | ICD-10-CM | POA: Diagnosis not present

## 2019-12-20 DIAGNOSIS — R079 Chest pain, unspecified: Secondary | ICD-10-CM | POA: Insufficient documentation

## 2019-12-20 DIAGNOSIS — M7918 Myalgia, other site: Secondary | ICD-10-CM

## 2019-12-20 DIAGNOSIS — Y93I9 Activity, other involving external motion: Secondary | ICD-10-CM | POA: Diagnosis not present

## 2019-12-20 DIAGNOSIS — Y92410 Unspecified street and highway as the place of occurrence of the external cause: Secondary | ICD-10-CM | POA: Insufficient documentation

## 2019-12-20 DIAGNOSIS — I10 Essential (primary) hypertension: Secondary | ICD-10-CM | POA: Insufficient documentation

## 2019-12-20 DIAGNOSIS — Y999 Unspecified external cause status: Secondary | ICD-10-CM | POA: Insufficient documentation

## 2019-12-20 DIAGNOSIS — M25572 Pain in left ankle and joints of left foot: Secondary | ICD-10-CM | POA: Diagnosis not present

## 2019-12-20 MED ORDER — ORPHENADRINE CITRATE ER 100 MG PO TB12: 100.0000 mg | ORAL_TABLET | Freq: Two times a day (BID) | ORAL | 1 refills | Status: AC

## 2019-12-20 MED ORDER — NAPROXEN 500 MG PO TABS
500.0000 mg | ORAL_TABLET | Freq: Two times a day (BID) | ORAL | Status: DC
Start: 2019-12-20 — End: 2022-05-13
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
## 2019-12-20 NOTE — Discharge Instructions (Signed)
Follow discharge care instructions and take medication as directed. 

## 2020-01-01 ENCOUNTER — Telehealth: Payer: Self-pay | Admitting: Registered Nurse

## 2020-01-01 ENCOUNTER — Encounter: Payer: Self-pay | Admitting: Registered Nurse

## 2020-01-01 NOTE — Telephone Encounter (Signed)
Please call patient  To see if she is still interested in seeing dietitian.  She missed two scheduled appts  Received in Epic "Your patient whom you referred for the Medical Nutrition Therapy Program has not come as you had requested.  Please discuss this with your patient.  When he/she decides to commit to the program, please let us know.   Scheduled first appointment but had wrong week. Rescheduled to another date and no showed.    Thank you for this referral, and if we can be of further assistance to you or your patient, let us know.     Cameron Proud  [facility]  [facility]  Administrative Clinical Assistant  Direct Dial: [phone]  Fax: [phone]  Website: [email or web address] "

## 2020-01-13 DIAGNOSIS — G894 Chronic pain syndrome: Secondary | ICD-10-CM | POA: Insufficient documentation

## 2020-01-13 DIAGNOSIS — M519 Unspecified thoracic, thoracolumbar and lumbosacral intervertebral disc disorder: Secondary | ICD-10-CM | POA: Insufficient documentation

## 2020-01-13 DIAGNOSIS — Z8616 Personal history of COVID-19: Secondary | ICD-10-CM | POA: Insufficient documentation

## 2020-01-21 ENCOUNTER — Other Ambulatory Visit: Payer: Self-pay | Admitting: Certified Nurse Midwife

## 2020-01-21 DIAGNOSIS — N6310 Unspecified lump in the right breast, unspecified quadrant: Secondary | ICD-10-CM

## 2020-01-21 DIAGNOSIS — Z1231 Encounter for screening mammogram for malignant neoplasm of breast: Secondary | ICD-10-CM

## 2020-01-21 DIAGNOSIS — N6320 Unspecified lump in the left breast, unspecified quadrant: Secondary | ICD-10-CM

## 2020-01-25 ENCOUNTER — Other Ambulatory Visit: Payer: Self-pay | Admitting: Physician Assistant

## 2020-01-25 DIAGNOSIS — G4700 Insomnia, unspecified: Secondary | ICD-10-CM

## 2020-02-19 ENCOUNTER — Ambulatory Visit
Admission: RE | Admit: 2020-02-19 | Discharge: 2020-02-19 | Disposition: A | Discharge status: Home / Self Care | Payer: 59 | Source: Ambulatory Visit | Attending: Certified Nurse Midwife | Admitting: Certified Nurse Midwife

## 2020-02-19 DIAGNOSIS — Z1231 Encounter for screening mammogram for malignant neoplasm of breast: Secondary | ICD-10-CM

## 2020-02-19 DIAGNOSIS — N6310 Unspecified lump in the right breast, unspecified quadrant: Secondary | ICD-10-CM | POA: Insufficient documentation

## 2020-02-19 DIAGNOSIS — N6320 Unspecified lump in the left breast, unspecified quadrant: Secondary | ICD-10-CM | POA: Diagnosis present

## 2020-04-07 DIAGNOSIS — M5136 Other intervertebral disc degeneration, lumbar region: Secondary | ICD-10-CM | POA: Diagnosis not present

## 2020-04-07 DIAGNOSIS — G894 Chronic pain syndrome: Secondary | ICD-10-CM | POA: Diagnosis not present

## 2020-04-07 DIAGNOSIS — M961 Postlaminectomy syndrome, not elsewhere classified: Secondary | ICD-10-CM | POA: Diagnosis not present

## 2020-04-07 DIAGNOSIS — M25559 Pain in unspecified hip: Secondary | ICD-10-CM | POA: Diagnosis not present

## 2020-04-16 DIAGNOSIS — R0600 Dyspnea, unspecified: Secondary | ICD-10-CM | POA: Diagnosis not present

## 2020-04-21 ENCOUNTER — Other Ambulatory Visit: Payer: Self-pay

## 2020-04-21 DIAGNOSIS — Z1152 Encounter for screening for COVID-19: Secondary | ICD-10-CM

## 2020-04-21 NOTE — Progress Notes (Signed)
Pt presented with covid symptoms: Runny nose. Symptoms started Sunday 04/19/20

## 2020-04-22 LAB — NOVEL CORONAVIRUS, NAA: SARS-CoV-2, NAA: NOT DETECTED

## 2020-04-22 LAB — SARS-COV-2, NAA 2 DAY TAT

## 2020-05-19 DIAGNOSIS — Z79891 Long term (current) use of opiate analgesic: Secondary | ICD-10-CM | POA: Diagnosis not present

## 2020-05-19 DIAGNOSIS — M25559 Pain in unspecified hip: Secondary | ICD-10-CM | POA: Diagnosis not present

## 2020-05-19 DIAGNOSIS — M961 Postlaminectomy syndrome, not elsewhere classified: Secondary | ICD-10-CM | POA: Diagnosis not present

## 2020-05-19 DIAGNOSIS — Z79899 Other long term (current) drug therapy: Secondary | ICD-10-CM | POA: Diagnosis not present

## 2020-05-19 DIAGNOSIS — G894 Chronic pain syndrome: Secondary | ICD-10-CM | POA: Diagnosis not present

## 2020-05-19 DIAGNOSIS — M5136 Other intervertebral disc degeneration, lumbar region: Secondary | ICD-10-CM | POA: Diagnosis not present

## 2020-06-29 DIAGNOSIS — R918 Other nonspecific abnormal finding of lung field: Secondary | ICD-10-CM | POA: Insufficient documentation

## 2020-07-02 ENCOUNTER — Other Ambulatory Visit (HOSPITAL_COMMUNITY): Payer: Self-pay | Admitting: Internal Medicine

## 2020-07-02 ENCOUNTER — Other Ambulatory Visit: Payer: Self-pay | Admitting: Internal Medicine

## 2020-07-02 DIAGNOSIS — R918 Other nonspecific abnormal finding of lung field: Secondary | ICD-10-CM

## 2020-07-16 DIAGNOSIS — Z79899 Other long term (current) drug therapy: Secondary | ICD-10-CM | POA: Diagnosis not present

## 2020-07-16 DIAGNOSIS — Z79891 Long term (current) use of opiate analgesic: Secondary | ICD-10-CM | POA: Diagnosis not present

## 2020-07-16 DIAGNOSIS — M5136 Other intervertebral disc degeneration, lumbar region: Secondary | ICD-10-CM | POA: Diagnosis not present

## 2020-07-16 DIAGNOSIS — M25559 Pain in unspecified hip: Secondary | ICD-10-CM | POA: Diagnosis not present

## 2020-07-16 DIAGNOSIS — M961 Postlaminectomy syndrome, not elsewhere classified: Secondary | ICD-10-CM | POA: Diagnosis not present

## 2020-07-16 DIAGNOSIS — G894 Chronic pain syndrome: Secondary | ICD-10-CM | POA: Diagnosis not present

## 2020-07-29 ENCOUNTER — Other Ambulatory Visit: Payer: Self-pay

## 2020-07-29 ENCOUNTER — Ambulatory Visit
Admission: RE | Admit: 2020-07-29 | Discharge: 2020-07-29 | Disposition: A | Discharge status: Home / Self Care | Payer: 59 | Source: Ambulatory Visit | Attending: Internal Medicine | Admitting: Internal Medicine

## 2020-07-29 DIAGNOSIS — R911 Solitary pulmonary nodule: Secondary | ICD-10-CM | POA: Diagnosis not present

## 2020-07-29 DIAGNOSIS — R918 Other nonspecific abnormal finding of lung field: Secondary | ICD-10-CM | POA: Diagnosis not present

## 2020-07-29 MED ORDER — IOHEXOL 300 MG/ML  SOLN
75.0000 mL | Freq: Once | INTRAMUSCULAR | Status: AC | PRN
  Administered 2020-07-29: 75 mL via INTRAVENOUS

## 2020-08-03 DIAGNOSIS — R918 Other nonspecific abnormal finding of lung field: Secondary | ICD-10-CM | POA: Diagnosis not present

## 2020-08-03 DIAGNOSIS — I10 Essential (primary) hypertension: Secondary | ICD-10-CM | POA: Insufficient documentation

## 2020-08-03 DIAGNOSIS — Z23 Encounter for immunization: Secondary | ICD-10-CM | POA: Diagnosis not present

## 2020-08-25 ENCOUNTER — Ambulatory Visit: Payer: Self-pay

## 2020-08-25 ENCOUNTER — Other Ambulatory Visit: Payer: Self-pay

## 2020-08-25 DIAGNOSIS — Z1152 Encounter for screening for COVID-19: Secondary | ICD-10-CM

## 2020-08-25 NOTE — Progress Notes (Signed)
Presents to [facility] for on-site outdoor specimen collection for covid screen.  Symptomatic - started 08/24/20 Runny nose & sinus drainage  Unvaccinated  Has Mychart  AMD

## 2020-08-27 LAB — NOVEL CORONAVIRUS, NAA: SARS-CoV-2, NAA: NOT DETECTED

## 2020-08-27 LAB — SARS-COV-2, NAA 2 DAY TAT

## 2020-09-15 DIAGNOSIS — Z79891 Long term (current) use of opiate analgesic: Secondary | ICD-10-CM | POA: Diagnosis not present

## 2020-09-15 DIAGNOSIS — M961 Postlaminectomy syndrome, not elsewhere classified: Secondary | ICD-10-CM | POA: Diagnosis not present

## 2020-09-15 DIAGNOSIS — M25559 Pain in unspecified hip: Secondary | ICD-10-CM | POA: Diagnosis not present

## 2020-09-15 DIAGNOSIS — Z79899 Other long term (current) drug therapy: Secondary | ICD-10-CM | POA: Diagnosis not present

## 2020-09-15 DIAGNOSIS — M5136 Other intervertebral disc degeneration, lumbar region: Secondary | ICD-10-CM | POA: Diagnosis not present

## 2020-09-15 DIAGNOSIS — G894 Chronic pain syndrome: Secondary | ICD-10-CM | POA: Diagnosis not present

## 2020-09-22 DIAGNOSIS — Z20822 Contact with and (suspected) exposure to covid-19: Secondary | ICD-10-CM | POA: Diagnosis not present

## 2020-09-22 DIAGNOSIS — Z03818 Encounter for observation for suspected exposure to other biological agents ruled out: Secondary | ICD-10-CM | POA: Diagnosis not present

## 2020-10-06 ENCOUNTER — Other Ambulatory Visit: Payer: Self-pay | Admitting: Physician Assistant

## 2020-10-06 DIAGNOSIS — G43919 Migraine, unspecified, intractable, without status migrainosus: Secondary | ICD-10-CM

## 2020-10-07 DIAGNOSIS — Z03818 Encounter for observation for suspected exposure to other biological agents ruled out: Secondary | ICD-10-CM | POA: Diagnosis not present

## 2020-10-07 DIAGNOSIS — Z20822 Contact with and (suspected) exposure to covid-19: Secondary | ICD-10-CM | POA: Diagnosis not present

## 2020-10-27 DIAGNOSIS — M5136 Other intervertebral disc degeneration, lumbar region: Secondary | ICD-10-CM | POA: Diagnosis not present

## 2020-10-27 DIAGNOSIS — G894 Chronic pain syndrome: Secondary | ICD-10-CM | POA: Diagnosis not present

## 2020-10-27 DIAGNOSIS — M25559 Pain in unspecified hip: Secondary | ICD-10-CM | POA: Diagnosis not present

## 2020-10-27 DIAGNOSIS — M961 Postlaminectomy syndrome, not elsewhere classified: Secondary | ICD-10-CM | POA: Diagnosis not present

## 2020-11-24 DIAGNOSIS — M25559 Pain in unspecified hip: Secondary | ICD-10-CM | POA: Diagnosis not present

## 2020-11-24 DIAGNOSIS — G894 Chronic pain syndrome: Secondary | ICD-10-CM | POA: Diagnosis not present

## 2020-11-24 DIAGNOSIS — M961 Postlaminectomy syndrome, not elsewhere classified: Secondary | ICD-10-CM | POA: Diagnosis not present

## 2020-11-24 DIAGNOSIS — M5136 Other intervertebral disc degeneration, lumbar region: Secondary | ICD-10-CM | POA: Diagnosis not present

## 2020-11-30 ENCOUNTER — Telehealth: Payer: Self-pay

## 2020-11-30 NOTE — Telephone Encounter (Signed)
Rx refill request already in Epic - re-routed to [employer], PA-C.  AMd

## 2020-12-29 DIAGNOSIS — M5459 Other low back pain: Secondary | ICD-10-CM | POA: Diagnosis not present

## 2020-12-29 DIAGNOSIS — Z79891 Long term (current) use of opiate analgesic: Secondary | ICD-10-CM | POA: Diagnosis not present

## 2020-12-29 DIAGNOSIS — M961 Postlaminectomy syndrome, not elsewhere classified: Secondary | ICD-10-CM | POA: Diagnosis not present

## 2020-12-29 DIAGNOSIS — Z79899 Other long term (current) drug therapy: Secondary | ICD-10-CM | POA: Diagnosis not present

## 2020-12-29 DIAGNOSIS — G894 Chronic pain syndrome: Secondary | ICD-10-CM | POA: Diagnosis not present

## 2020-12-29 DIAGNOSIS — M5136 Other intervertebral disc degeneration, lumbar region: Secondary | ICD-10-CM | POA: Diagnosis not present

## 2021-01-28 ENCOUNTER — Other Ambulatory Visit: Payer: Self-pay | Admitting: Internal Medicine

## 2021-01-28 DIAGNOSIS — Z1231 Encounter for screening mammogram for malignant neoplasm of breast: Secondary | ICD-10-CM

## 2021-02-01 DIAGNOSIS — Z0000 Encounter for general adult medical examination without abnormal findings: Secondary | ICD-10-CM | POA: Diagnosis not present

## 2021-02-04 DIAGNOSIS — M5459 Other low back pain: Secondary | ICD-10-CM | POA: Diagnosis not present

## 2021-02-04 DIAGNOSIS — G894 Chronic pain syndrome: Secondary | ICD-10-CM | POA: Diagnosis not present

## 2021-02-04 DIAGNOSIS — M961 Postlaminectomy syndrome, not elsewhere classified: Secondary | ICD-10-CM | POA: Diagnosis not present

## 2021-02-04 DIAGNOSIS — M5136 Other intervertebral disc degeneration, lumbar region: Secondary | ICD-10-CM | POA: Diagnosis not present

## 2021-02-25 ENCOUNTER — Ambulatory Visit
Admission: RE | Admit: 2021-02-25 | Discharge: 2021-02-25 | Disposition: A | Discharge status: Home / Self Care | Payer: 59 | Source: Ambulatory Visit | Attending: Internal Medicine | Admitting: Internal Medicine

## 2021-02-25 ENCOUNTER — Other Ambulatory Visit: Payer: Self-pay

## 2021-02-25 DIAGNOSIS — Z1231 Encounter for screening mammogram for malignant neoplasm of breast: Secondary | ICD-10-CM

## 2021-03-11 DIAGNOSIS — M5136 Other intervertebral disc degeneration, lumbar region: Secondary | ICD-10-CM | POA: Diagnosis not present

## 2021-03-11 DIAGNOSIS — M5459 Other low back pain: Secondary | ICD-10-CM | POA: Diagnosis not present

## 2021-03-11 DIAGNOSIS — G894 Chronic pain syndrome: Secondary | ICD-10-CM | POA: Diagnosis not present

## 2021-03-11 DIAGNOSIS — M961 Postlaminectomy syndrome, not elsewhere classified: Secondary | ICD-10-CM | POA: Diagnosis not present

## 2021-04-09 DIAGNOSIS — R002 Palpitations: Secondary | ICD-10-CM | POA: Diagnosis not present

## 2021-04-09 DIAGNOSIS — Z8616 Personal history of COVID-19: Secondary | ICD-10-CM | POA: Diagnosis not present

## 2021-04-21 DIAGNOSIS — M5136 Other intervertebral disc degeneration, lumbar region: Secondary | ICD-10-CM | POA: Diagnosis not present

## 2021-04-21 DIAGNOSIS — M961 Postlaminectomy syndrome, not elsewhere classified: Secondary | ICD-10-CM | POA: Diagnosis not present

## 2021-04-21 DIAGNOSIS — G894 Chronic pain syndrome: Secondary | ICD-10-CM | POA: Diagnosis not present

## 2021-04-21 DIAGNOSIS — M5459 Other low back pain: Secondary | ICD-10-CM | POA: Diagnosis not present

## 2021-05-14 ENCOUNTER — Ambulatory Visit: Payer: 59 | Admitting: Dietician

## 2021-05-26 ENCOUNTER — Ambulatory Visit: Payer: 59 | Admitting: Dermatology

## 2021-05-27 DIAGNOSIS — M5136 Other intervertebral disc degeneration, lumbar region: Secondary | ICD-10-CM | POA: Diagnosis not present

## 2021-05-27 DIAGNOSIS — G894 Chronic pain syndrome: Secondary | ICD-10-CM | POA: Diagnosis not present

## 2021-05-27 DIAGNOSIS — M961 Postlaminectomy syndrome, not elsewhere classified: Secondary | ICD-10-CM | POA: Diagnosis not present

## 2021-06-14 ENCOUNTER — Encounter: Payer: Self-pay | Admitting: Dietician

## 2021-06-14 NOTE — Progress Notes (Signed)
Have not heard back from patient to reschedule her cancelled appointment from 05/14/21. Sent notification to referring provider.

## 2021-07-08 DIAGNOSIS — Z79899 Other long term (current) drug therapy: Secondary | ICD-10-CM | POA: Diagnosis not present

## 2021-07-08 DIAGNOSIS — Z79891 Long term (current) use of opiate analgesic: Secondary | ICD-10-CM | POA: Diagnosis not present

## 2021-07-08 DIAGNOSIS — M5136 Other intervertebral disc degeneration, lumbar region: Secondary | ICD-10-CM | POA: Diagnosis not present

## 2021-07-08 DIAGNOSIS — M961 Postlaminectomy syndrome, not elsewhere classified: Secondary | ICD-10-CM | POA: Diagnosis not present

## 2021-07-08 DIAGNOSIS — G894 Chronic pain syndrome: Secondary | ICD-10-CM | POA: Diagnosis not present

## 2021-08-12 DIAGNOSIS — G894 Chronic pain syndrome: Secondary | ICD-10-CM | POA: Diagnosis not present

## 2021-08-12 DIAGNOSIS — M961 Postlaminectomy syndrome, not elsewhere classified: Secondary | ICD-10-CM | POA: Diagnosis not present

## 2021-08-12 DIAGNOSIS — M5136 Other intervertebral disc degeneration, lumbar region: Secondary | ICD-10-CM | POA: Diagnosis not present

## 2021-08-12 DIAGNOSIS — Z79899 Other long term (current) drug therapy: Secondary | ICD-10-CM | POA: Diagnosis not present

## 2021-08-16 IMAGING — MG MM DIGITAL SCREENING BILAT W/ TOMO AND CAD
6 of 10 series · 6 of 30 positions shown · non-contrast
Comparison: Previous exam(s).

CLINICAL DATA: Screening.

EXAM:
DIGITAL SCREENING BILATERAL MAMMOGRAM WITH TOMOSYNTHESIS AND CAD
TECHNIQUE: Bilateral screening digital craniocaudal and mediolateral oblique
mammograms were obtained. Bilateral screening digital breast
tomosynthesis was performed. The images were evaluated with
computer-aided detection.

[R CC synth-2D]
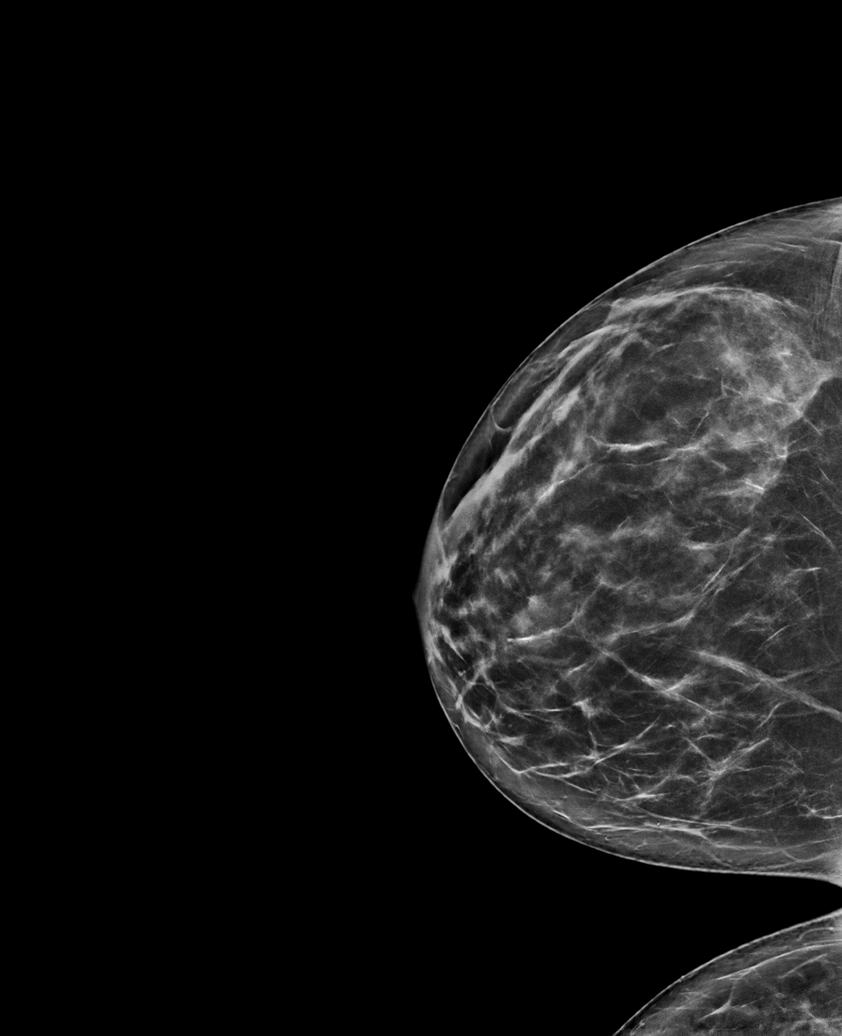

[R MLO synth-2D]
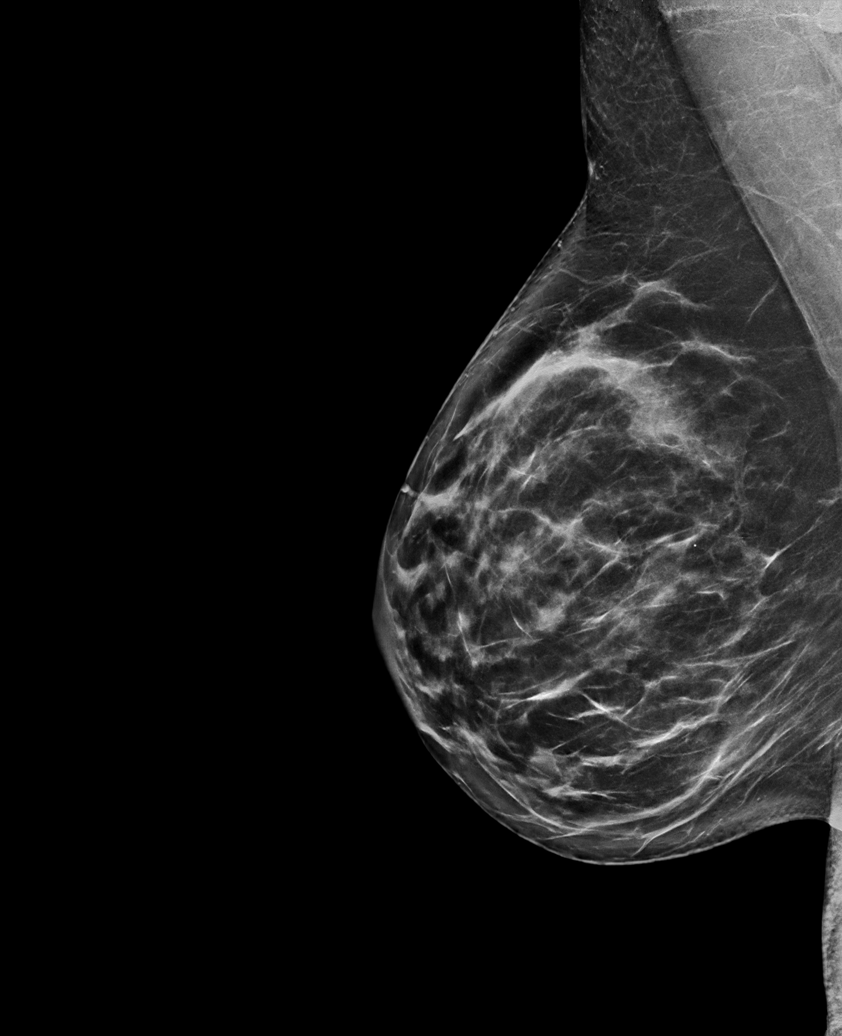

[L MLO synth-2D (1 of 2)]
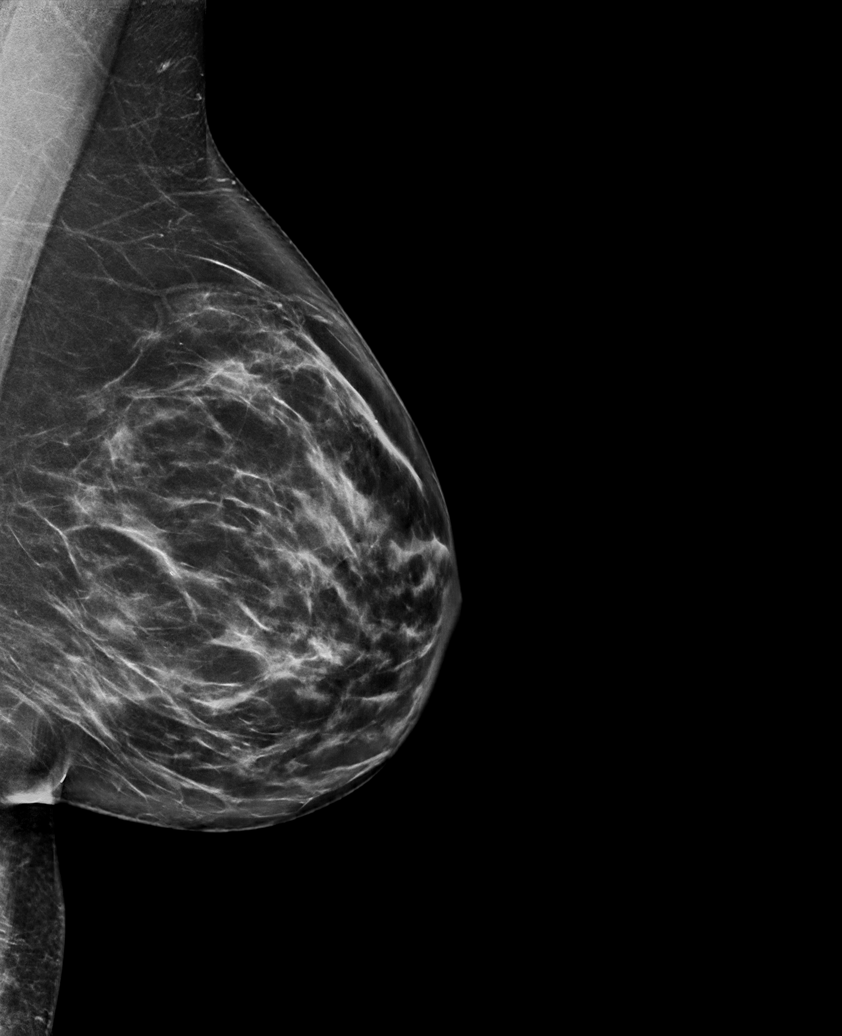

[L MLO synth-2D (2 of 2)]
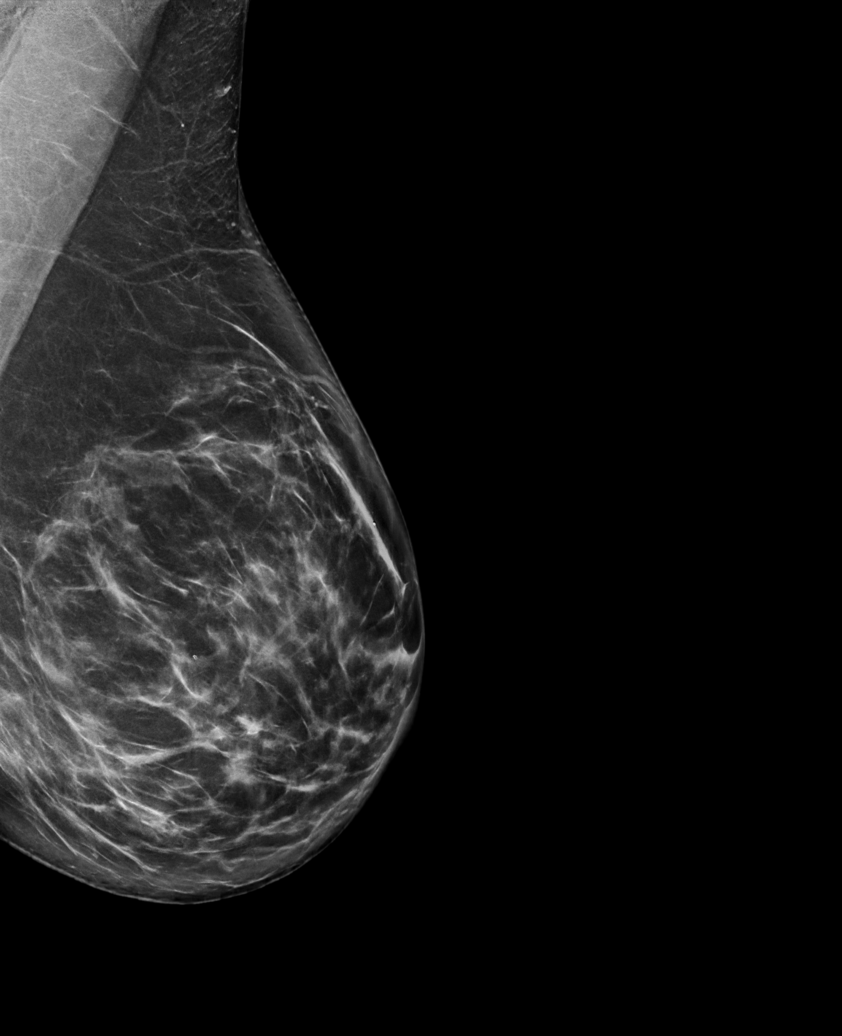

[L CC synth-2D]
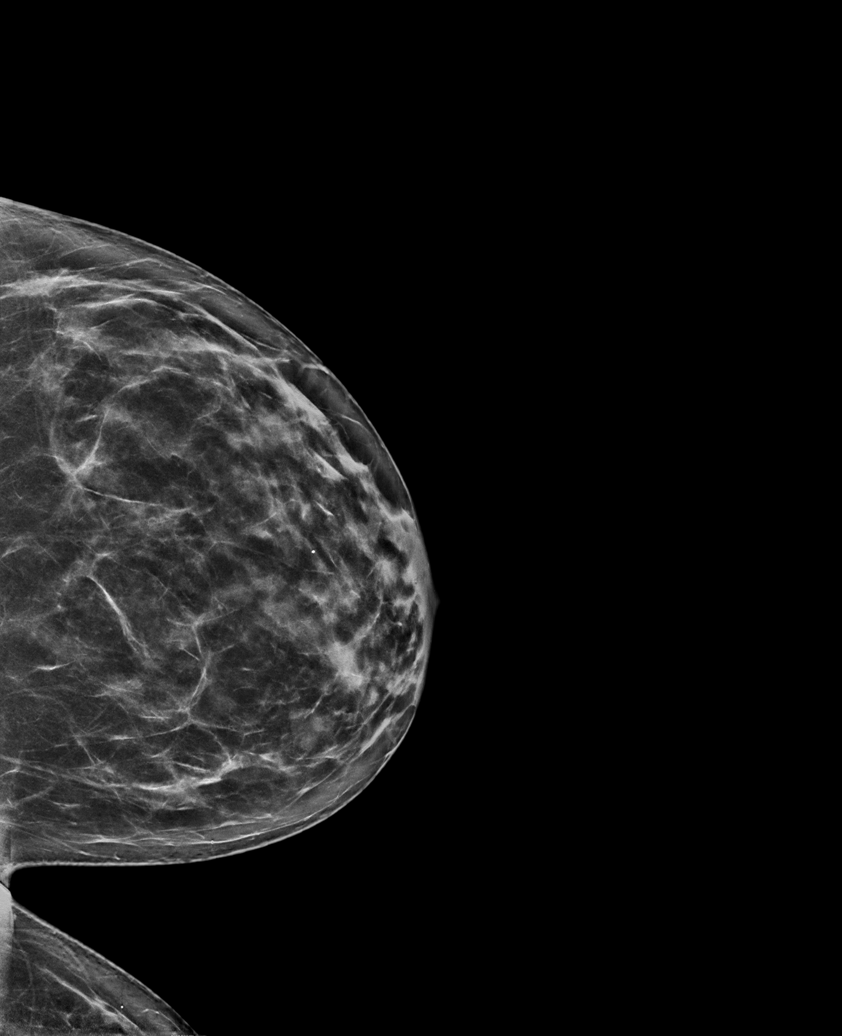

[L CC tomo · tomo slice 38/75.0]
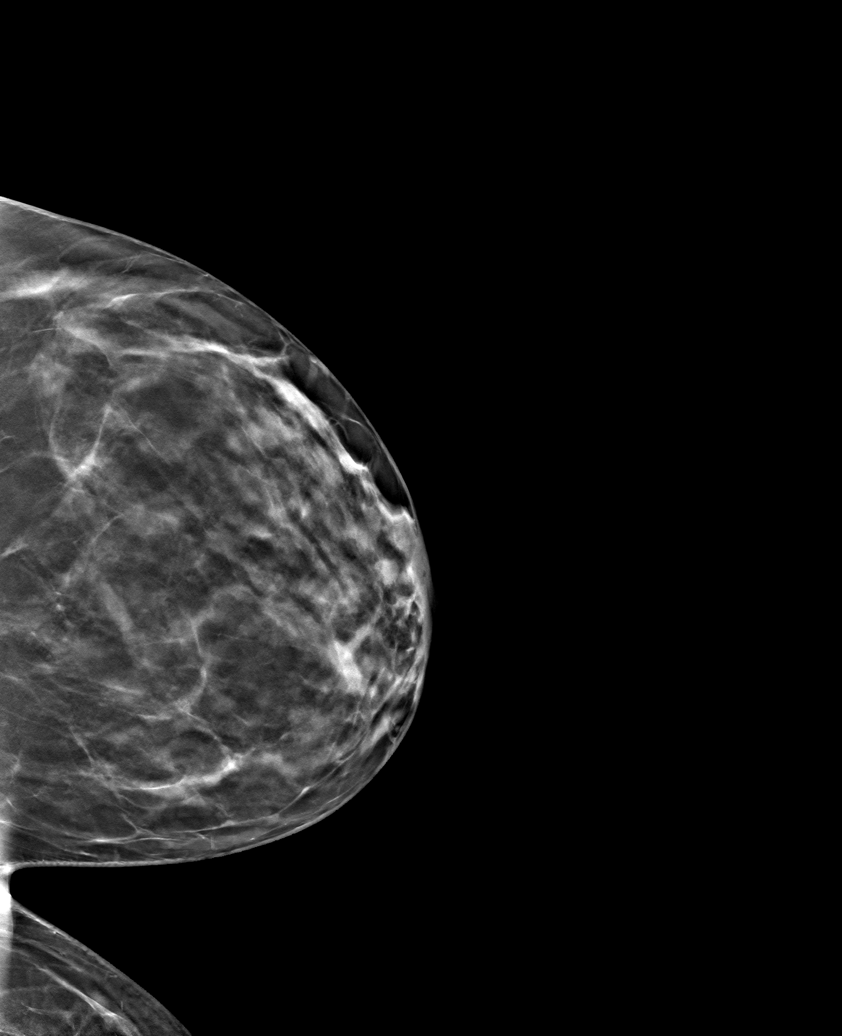

[6 of 30 positions shown; findings below may reference images not displayed]

ACR Breast Density Category c: The breast tissue is heterogeneously
dense, which may obscure small masses.
FINDINGS: There are no findings suspicious for malignancy.
IMPRESSION: No mammographic evidence of malignancy. A result letter of this
screening mammogram will be mailed directly to the patient.

RECOMMENDATION:
Screening mammogram in one year. (Code:Q3-W-BC3)

BI-RADS CATEGORY  1: Negative.

## 2021-09-14 DIAGNOSIS — R059 Cough, unspecified: Secondary | ICD-10-CM | POA: Diagnosis not present

## 2021-09-14 DIAGNOSIS — R0602 Shortness of breath: Secondary | ICD-10-CM | POA: Diagnosis not present

## 2021-09-14 DIAGNOSIS — Z8616 Personal history of COVID-19: Secondary | ICD-10-CM | POA: Diagnosis not present

## 2021-09-14 DIAGNOSIS — J111 Influenza due to unidentified influenza virus with other respiratory manifestations: Secondary | ICD-10-CM | POA: Diagnosis not present

## 2021-09-16 DIAGNOSIS — G894 Chronic pain syndrome: Secondary | ICD-10-CM | POA: Diagnosis not present

## 2021-09-16 DIAGNOSIS — M5136 Other intervertebral disc degeneration, lumbar region: Secondary | ICD-10-CM | POA: Diagnosis not present

## 2021-09-16 DIAGNOSIS — M961 Postlaminectomy syndrome, not elsewhere classified: Secondary | ICD-10-CM | POA: Diagnosis not present

## 2021-11-04 DIAGNOSIS — Z79891 Long term (current) use of opiate analgesic: Secondary | ICD-10-CM | POA: Diagnosis not present

## 2021-11-04 DIAGNOSIS — G894 Chronic pain syndrome: Secondary | ICD-10-CM | POA: Diagnosis not present

## 2021-11-04 DIAGNOSIS — M5136 Other intervertebral disc degeneration, lumbar region: Secondary | ICD-10-CM | POA: Diagnosis not present

## 2021-11-04 DIAGNOSIS — M961 Postlaminectomy syndrome, not elsewhere classified: Secondary | ICD-10-CM | POA: Diagnosis not present

## 2021-12-01 DIAGNOSIS — M961 Postlaminectomy syndrome, not elsewhere classified: Secondary | ICD-10-CM | POA: Diagnosis not present

## 2021-12-01 DIAGNOSIS — Z79891 Long term (current) use of opiate analgesic: Secondary | ICD-10-CM | POA: Diagnosis not present

## 2021-12-01 DIAGNOSIS — M5136 Other intervertebral disc degeneration, lumbar region: Secondary | ICD-10-CM | POA: Diagnosis not present

## 2021-12-01 DIAGNOSIS — G894 Chronic pain syndrome: Secondary | ICD-10-CM | POA: Diagnosis not present

## 2022-01-05 DIAGNOSIS — G894 Chronic pain syndrome: Secondary | ICD-10-CM | POA: Diagnosis not present

## 2022-01-05 DIAGNOSIS — M5136 Other intervertebral disc degeneration, lumbar region: Secondary | ICD-10-CM | POA: Diagnosis not present

## 2022-01-05 DIAGNOSIS — M961 Postlaminectomy syndrome, not elsewhere classified: Secondary | ICD-10-CM | POA: Diagnosis not present

## 2022-01-05 DIAGNOSIS — Z79891 Long term (current) use of opiate analgesic: Secondary | ICD-10-CM | POA: Diagnosis not present

## 2022-01-28 DIAGNOSIS — Z0000 Encounter for general adult medical examination without abnormal findings: Secondary | ICD-10-CM | POA: Diagnosis not present

## 2022-02-01 DIAGNOSIS — M5136 Other intervertebral disc degeneration, lumbar region: Secondary | ICD-10-CM | POA: Diagnosis not present

## 2022-02-01 DIAGNOSIS — G894 Chronic pain syndrome: Secondary | ICD-10-CM | POA: Diagnosis not present

## 2022-02-01 DIAGNOSIS — Z79891 Long term (current) use of opiate analgesic: Secondary | ICD-10-CM | POA: Diagnosis not present

## 2022-02-01 DIAGNOSIS — M961 Postlaminectomy syndrome, not elsewhere classified: Secondary | ICD-10-CM | POA: Diagnosis not present

## 2022-02-02 ENCOUNTER — Other Ambulatory Visit: Payer: Self-pay | Admitting: Internal Medicine

## 2022-02-02 DIAGNOSIS — Z0000 Encounter for general adult medical examination without abnormal findings: Secondary | ICD-10-CM | POA: Diagnosis not present

## 2022-02-02 DIAGNOSIS — R7401 Elevation of levels of liver transaminase levels: Secondary | ICD-10-CM | POA: Diagnosis not present

## 2022-02-02 DIAGNOSIS — Z1231 Encounter for screening mammogram for malignant neoplasm of breast: Secondary | ICD-10-CM

## 2022-03-04 ENCOUNTER — Other Ambulatory Visit: Payer: Self-pay | Admitting: Emergency Medicine

## 2022-03-04 DIAGNOSIS — H918X1 Other specified hearing loss, right ear: Secondary | ICD-10-CM

## 2022-03-04 DIAGNOSIS — R519 Headache, unspecified: Secondary | ICD-10-CM

## 2022-03-04 DIAGNOSIS — R202 Paresthesia of skin: Secondary | ICD-10-CM

## 2022-03-12 ENCOUNTER — Ambulatory Visit
Admission: RE | Admit: 2022-03-12 | Discharge: 2022-03-12 | Disposition: A | Discharge status: Home / Self Care | Payer: 59 | Source: Ambulatory Visit | Attending: Emergency Medicine | Admitting: Emergency Medicine

## 2022-03-12 DIAGNOSIS — R202 Paresthesia of skin: Secondary | ICD-10-CM | POA: Diagnosis not present

## 2022-03-12 DIAGNOSIS — R519 Headache, unspecified: Secondary | ICD-10-CM | POA: Insufficient documentation

## 2022-03-12 DIAGNOSIS — H9190 Unspecified hearing loss, unspecified ear: Secondary | ICD-10-CM | POA: Diagnosis not present

## 2022-03-12 DIAGNOSIS — H918X1 Other specified hearing loss, right ear: Secondary | ICD-10-CM | POA: Diagnosis not present

## 2022-03-12 DIAGNOSIS — R200 Anesthesia of skin: Secondary | ICD-10-CM | POA: Diagnosis not present

## 2022-03-12 MED ORDER — GADOBUTROL 1 MMOL/ML IV SOLN
7.0000 mL | Freq: Once | INTRAVENOUS | Status: AC | PRN
  Administered 2022-03-12: 7.5 mL via INTRAVENOUS

## 2022-03-14 DIAGNOSIS — Z79891 Long term (current) use of opiate analgesic: Secondary | ICD-10-CM | POA: Diagnosis not present

## 2022-03-14 DIAGNOSIS — M961 Postlaminectomy syndrome, not elsewhere classified: Secondary | ICD-10-CM | POA: Diagnosis not present

## 2022-03-14 DIAGNOSIS — M5136 Other intervertebral disc degeneration, lumbar region: Secondary | ICD-10-CM | POA: Diagnosis not present

## 2022-03-14 DIAGNOSIS — G894 Chronic pain syndrome: Secondary | ICD-10-CM | POA: Diagnosis not present

## 2022-04-11 ENCOUNTER — Ambulatory Visit
Admission: RE | Admit: 2022-04-11 | Discharge: 2022-04-11 | Disposition: A | Discharge status: Home / Self Care | Payer: 59 | Source: Ambulatory Visit | Attending: Internal Medicine | Admitting: Internal Medicine

## 2022-04-11 DIAGNOSIS — Z1231 Encounter for screening mammogram for malignant neoplasm of breast: Secondary | ICD-10-CM | POA: Insufficient documentation

## 2022-04-18 DIAGNOSIS — M792 Neuralgia and neuritis, unspecified: Secondary | ICD-10-CM | POA: Diagnosis not present

## 2022-04-18 DIAGNOSIS — H9041 Sensorineural hearing loss, unilateral, right ear, with unrestricted hearing on the contralateral side: Secondary | ICD-10-CM | POA: Diagnosis not present

## 2022-04-18 DIAGNOSIS — H9121 Sudden idiopathic hearing loss, right ear: Secondary | ICD-10-CM | POA: Diagnosis not present

## 2022-04-20 DIAGNOSIS — M961 Postlaminectomy syndrome, not elsewhere classified: Secondary | ICD-10-CM | POA: Diagnosis not present

## 2022-04-20 DIAGNOSIS — G894 Chronic pain syndrome: Secondary | ICD-10-CM | POA: Diagnosis not present

## 2022-04-20 DIAGNOSIS — M5136 Other intervertebral disc degeneration, lumbar region: Secondary | ICD-10-CM | POA: Diagnosis not present

## 2022-04-20 DIAGNOSIS — Z79891 Long term (current) use of opiate analgesic: Secondary | ICD-10-CM | POA: Diagnosis not present

## 2022-05-10 DIAGNOSIS — H9120 Sudden idiopathic hearing loss, unspecified ear: Secondary | ICD-10-CM | POA: Diagnosis not present

## 2022-05-10 DIAGNOSIS — M792 Neuralgia and neuritis, unspecified: Secondary | ICD-10-CM | POA: Diagnosis not present

## 2022-05-10 DIAGNOSIS — H5213 Myopia, bilateral: Secondary | ICD-10-CM | POA: Diagnosis not present

## 2022-05-10 DIAGNOSIS — H9121 Sudden idiopathic hearing loss, right ear: Secondary | ICD-10-CM | POA: Diagnosis not present

## 2022-05-12 ENCOUNTER — Encounter: Payer: Self-pay | Admitting: *Deleted

## 2022-05-13 ENCOUNTER — Ambulatory Visit (INDEPENDENT_AMBULATORY_CARE_PROVIDER_SITE_OTHER): Payer: 59 | Admitting: Psychiatry

## 2022-05-13 ENCOUNTER — Encounter: Payer: Self-pay | Admitting: Psychiatry

## 2022-05-13 VITALS — BP 112/78 | HR 81 | Ht 63.0 in | Wt 159.0 lb

## 2022-05-13 DIAGNOSIS — G509 Disorder of trigeminal nerve, unspecified: Secondary | ICD-10-CM

## 2022-05-13 DIAGNOSIS — M542 Cervicalgia: Secondary | ICD-10-CM

## 2022-05-13 MED ORDER — CYCLOBENZAPRINE HCL 5 MG PO TABS: 5.0000 mg | ORAL_TABLET | Freq: Three times a day (TID) | ORAL | 6 refills | Status: DC | PRN
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
## 2022-05-13 NOTE — Patient Instructions (Signed)
MRA of the head  Referral to physical therapy  Take cyclobenzaprine 5 mg up to three times a day as needed for muscle spasms and headaches

## 2022-05-23 ENCOUNTER — Telehealth: Payer: Self-pay | Admitting: Psychiatry

## 2022-05-23 NOTE — Telephone Encounter (Signed)
April Griffith: [id number] exp. 05/23/22-11/19/22 sent to [facility] for open MRI per patient

## 2022-05-25 DIAGNOSIS — M961 Postlaminectomy syndrome, not elsewhere classified: Secondary | ICD-10-CM | POA: Diagnosis not present

## 2022-05-25 DIAGNOSIS — G894 Chronic pain syndrome: Secondary | ICD-10-CM | POA: Diagnosis not present

## 2022-05-25 DIAGNOSIS — Z79891 Long term (current) use of opiate analgesic: Secondary | ICD-10-CM | POA: Diagnosis not present

## 2022-05-25 DIAGNOSIS — M5136 Other intervertebral disc degeneration, lumbar region: Secondary | ICD-10-CM | POA: Diagnosis not present

## 2022-06-01 ENCOUNTER — Ambulatory Visit: Payer: 59

## 2022-06-22 DIAGNOSIS — G894 Chronic pain syndrome: Secondary | ICD-10-CM | POA: Diagnosis not present

## 2022-06-22 DIAGNOSIS — M545 Low back pain, unspecified: Secondary | ICD-10-CM | POA: Diagnosis not present

## 2022-06-22 DIAGNOSIS — Z79891 Long term (current) use of opiate analgesic: Secondary | ICD-10-CM | POA: Diagnosis not present

## 2022-06-22 DIAGNOSIS — M961 Postlaminectomy syndrome, not elsewhere classified: Secondary | ICD-10-CM | POA: Diagnosis not present

## 2022-06-22 DIAGNOSIS — M5136 Other intervertebral disc degeneration, lumbar region: Secondary | ICD-10-CM | POA: Diagnosis not present

## 2022-06-27 ENCOUNTER — Ambulatory Visit: Payer: 59 | Admitting: Physical Therapy

## 2022-07-04 ENCOUNTER — Ambulatory Visit: Payer: 59 | Admitting: Physical Therapy

## 2022-07-06 ENCOUNTER — Ambulatory Visit: Payer: 59 | Admitting: Physical Therapy

## 2022-07-11 ENCOUNTER — Encounter: Payer: 59 | Admitting: Physical Therapy

## 2022-07-13 ENCOUNTER — Ambulatory Visit: Payer: 59 | Admitting: Physical Therapy

## 2022-07-18 ENCOUNTER — Encounter: Payer: 59 | Admitting: Physical Therapy

## 2022-07-20 ENCOUNTER — Encounter: Payer: 59 | Admitting: Physical Therapy

## 2022-07-25 ENCOUNTER — Ambulatory Visit: Payer: 59 | Attending: Psychiatry | Admitting: Physical Therapy

## 2022-07-25 DIAGNOSIS — Z79891 Long term (current) use of opiate analgesic: Secondary | ICD-10-CM | POA: Diagnosis not present

## 2022-07-25 DIAGNOSIS — M542 Cervicalgia: Secondary | ICD-10-CM | POA: Insufficient documentation

## 2022-07-25 DIAGNOSIS — R519 Headache, unspecified: Secondary | ICD-10-CM | POA: Diagnosis not present

## 2022-07-25 DIAGNOSIS — G894 Chronic pain syndrome: Secondary | ICD-10-CM | POA: Diagnosis not present

## 2022-07-25 DIAGNOSIS — M545 Low back pain, unspecified: Secondary | ICD-10-CM | POA: Diagnosis not present

## 2022-07-25 DIAGNOSIS — G43109 Migraine with aura, not intractable, without status migrainosus: Secondary | ICD-10-CM | POA: Insufficient documentation

## 2022-07-25 DIAGNOSIS — M5136 Other intervertebral disc degeneration, lumbar region: Secondary | ICD-10-CM | POA: Diagnosis not present

## 2022-07-25 DIAGNOSIS — M961 Postlaminectomy syndrome, not elsewhere classified: Secondary | ICD-10-CM | POA: Diagnosis not present

## 2022-07-27 ENCOUNTER — Ambulatory Visit: Payer: 59 | Admitting: Physical Therapy

## 2022-07-27 DIAGNOSIS — G43109 Migraine with aura, not intractable, without status migrainosus: Secondary | ICD-10-CM | POA: Diagnosis not present

## 2022-07-27 DIAGNOSIS — R519 Headache, unspecified: Secondary | ICD-10-CM

## 2022-07-27 DIAGNOSIS — M542 Cervicalgia: Secondary | ICD-10-CM | POA: Diagnosis not present
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
## 2022-08-01 ENCOUNTER — Ambulatory Visit: Payer: 59 | Attending: Psychiatry | Admitting: Physical Therapy

## 2022-08-03 ENCOUNTER — Ambulatory Visit: Payer: 59 | Admitting: Physical Therapy

## 2022-08-11 NOTE — Addendum Note (Signed)
Addended by: Cammie Mcgee on: 08/11/2022 03:13 PM   Modules accepted: Orders

## 2022-08-24 DIAGNOSIS — M5136 Other intervertebral disc degeneration, lumbar region: Secondary | ICD-10-CM | POA: Diagnosis not present

## 2022-08-24 DIAGNOSIS — M545 Low back pain, unspecified: Secondary | ICD-10-CM | POA: Diagnosis not present

## 2022-08-24 DIAGNOSIS — G894 Chronic pain syndrome: Secondary | ICD-10-CM | POA: Diagnosis not present

## 2022-08-24 DIAGNOSIS — M961 Postlaminectomy syndrome, not elsewhere classified: Secondary | ICD-10-CM | POA: Diagnosis not present

## 2022-08-24 DIAGNOSIS — Z79891 Long term (current) use of opiate analgesic: Secondary | ICD-10-CM | POA: Diagnosis not present

## 2022-08-25 ENCOUNTER — Telehealth (INDEPENDENT_AMBULATORY_CARE_PROVIDER_SITE_OTHER): Payer: 59 | Admitting: Adult Health

## 2022-08-25 ENCOUNTER — Encounter: Payer: Self-pay | Admitting: Adult Health

## 2022-08-25 ENCOUNTER — Telehealth: Payer: Self-pay | Admitting: Psychiatry

## 2022-08-25 DIAGNOSIS — G509 Disorder of trigeminal nerve, unspecified: Secondary | ICD-10-CM | POA: Diagnosis not present

## 2022-08-25 MED ORDER — CARBAMAZEPINE ER 100 MG PO CP12: 100.0000 mg | ORAL_CAPSULE | Freq: Two times a day (BID) | ORAL | 5 refills | Status: DC

## 2022-08-25 NOTE — Telephone Encounter (Signed)
..   Pt understands that although there may be some limitations with this type of visit, we will take all precautions to reduce any security or privacy concerns.  Pt understands that this will be treated like an in office visit and we will file with pt's insurance, and there may be a patient responsible charge related to this service. ? ?

## 2022-08-25 NOTE — Patient Instructions (Addendum)
Your Plan:  Recommend starting carbamazepine 100mg  twice daily   Please call after 1-2 weeks if no benefit or sooner if difficulty tolerating    Follow up in 3 months or call earlier if needed     Thank you for coming to see at [facility]. I hope we have been able to provide you high quality care today.  You may receive a patient satisfaction survey over the next few weeks. We would appreciate your feedback and comments so that we may continue to improve ourselves and the health of our patients.    Carbamazepine Tablets What is this medication? CARBAMAZEPINE (kar ba MAZ e peen) prevents and controls seizures in people with epilepsy. It may also be used to treat nerve pain. It works by calming overactive nerves in your body. This medicine may be used for other purposes; ask your health care provider or pharmacist if you have questions. COMMON BRAND NAME(S): Epitol, Tegretol What should I tell my care team before I take this medication? They need to know if you have any of these conditions: Asian ancestry Bone marrow disease Glaucoma Heart disease Irregular heartbeat or rhythm Kidney disease Liver disease Low blood cell levels (white cells, red cells, or platelets) Mental health conditions Porphyria Suicidal thoughts, plans, or attempt by you or a family member An unusual or allergic reaction to carbamazepine, other medications, foods, dyes, or preservatives Pregnant or trying to get pregnant Breastfeeding How should I use this medication? Take this medication by mouth with a glass of water. Follow the directions on the prescription label. Take this medication with food. Take your doses at regular intervals. Do not take your medication more often than directed. Do not stop taking this medication except on the advice of your care team. A special MedGuide will be given to you by the pharmacist with each prescription and refill. Be sure to read this  information carefully each time. Talk to your care team about the use of this medication in children. Special care may be needed. Overdosage: If you think you have taken too much of this medicine contact a poison control center or emergency room at once. NOTE: This medicine is only for you. Do not share this medicine with others. What if I miss a dose? If you miss a dose, take it as soon as you can. If it is almost time for your next dose, take only that dose. Do not take double or extra doses. What may interact with this medication? Do not take this medication with any of the following: Certain medications used to treat HIV infection or AIDS that are given in combination with cobicistat Delavirdine MAOIs like Carbex, Eldepryl, Marplan, Nardil, and Parnate Nefazodone Oxcarbazepine This medication may also interact with the following: Acetaminophen Acetazolamide Barbiturate medications for inducing sleep or treating seizures, like phenobarbital Certain antibiotics like clarithromycin, erythromycin or troleandomycin Cimetidine Cyclosporine Danazol Dicumarol Doxycycline Female hormones, including estrogens and birth control pills Grapefruit juice Isoniazid, INH Levothyroxine and other thyroid hormones Lithium and other medications to treat mood problems or psychotic disturbances Loratadine Medications for angina or high blood pressure Medications for cancer Medications for depression or anxiety Medications for sleep Medications to treat fungal infections, like fluconazole, itraconazole or ketoconazole Medications used to treat HIV infection or AIDS Methadone Niacinamide Praziquantel Propoxyphene Rifampin or rifabutin Seizure or epilepsy medication Steroid medications such as prednisone or cortisone Theophylline Tramadol Warfarin This list may not describe all possible interactions. Give your health care provider a list of  all the medicines, herbs, non-prescription drugs, or  dietary supplements you use. Also tell them if you smoke, drink alcohol, or use illegal drugs. Some items may interact with your medicine. What should I watch for while using this medication? Visit your care team for regular checks on your progress. Do not change brands or dosage forms of this medication without discussing it with your care team. If you are taking this medication for epilepsy (seizures), do not stop taking it suddenly. This increases the risk of seizures. Wear a Arboriculturist or necklace. Carry an identification card with information about your condition, medications, and care team. This medication may cause serious skin reactions. They can happen weeks to months after starting the medication. Contact your care team right away if you notice fevers or flu-like symptoms with a rash. The rash may be red or purple and then turn into blisters or peeling of the skin. You may also notice a red rash with swelling of the face, lips, or lymph nodes in your neck or under your arms. This medication may affect your coordination, reaction time, or judgment. Do not drive or operate machinery until you know how this medication affects you. Sit up or stand slowly to reduce the risk of dizzy or fainting spells. Drinking alcohol with this medication can increase the risk of these side effects. Estrogen and progestin hormones may not work as well while you are taking this medication. A barrier contraceptive, such as a condom or diaphragm, is recommended if you are using these hormones for contraception. Talk to your care team about effective forms of contraception. This medication can make you more sensitive to the sun. Keep out of the sun. If you cannot avoid being in the sun, wear protective clothing and sunscreen. Do not use sun lamps, tanning beds, or tanning booths. This medication may cause thoughts of suicide or depression. This includes sudden changes in mood, behaviors, or thoughts. These changes  can happen at any time but are more common in the beginning of treatment or after a change in dose. Call your care team right away if you experience these thoughts or worsening depression. Women who become pregnant while using this medication may enroll in the [place] American Antiepileptic Drug Pregnancy Registry by calling [phone]. This registry collects information about the safety of antiepileptic medication use during pregnancy. This medication may cause a decrease in vitamin D and folic acid. You should make sure that you get enough vitamins while you are taking this medication. Discuss the foods you eat and the vitamins you take with your care team. What side effects may I notice from receiving this medication? Side effects that you should report to your care team as soon as possible: Allergic reactions--skin rash, itching, hives, swelling of the face, lips, tongue, or throat Aplastic anemia--unusual weakness or fatigue, dizziness, headache, trouble breathing, increased bleeding or bruising Change in vision Heart rhythm changes--fast or irregular heartbeat, dizziness, feeling faint or lightheaded, chest pain, trouble breathing Infection--fever, chills, cough, or sore throat Liver injury--right upper belly pain, loss of appetite, nausea, light-colored stool, dark yellow or brown urine, yellowing skin or eyes, unusual weakness or fatigue Low sodium level--muscle weakness, fatigue, dizziness, headache, confusion Rash, fever, and swollen lymph nodes Redness, blistering, peeling or loosening of the skin, including inside the mouth Thoughts of suicide or self-harm, worsening mood, feelings of depression Side effects that usually do not require medical attention (report to your care team if they continue or are bothersome): Dizziness Drowsiness  Loss of balance or coordination Nausea Vomiting This list may not describe all possible side effects. Call your doctor for medical advice about side  effects. You may report side effects to FDA at 1-800-FDA-1088. Where should I keep my medication? Keep out of reach of children. Store at room temperature below 30 degrees C (86 degrees F). Keep container tightly closed. Protect from moisture. Throw away any unused medication after the expiration date. NOTE: This sheet is a summary. It may not cover all possible information. If you have questions about this medicine, talk to your doctor, pharmacist, or health care provider.  2023 Elsevier/Gold Standard (2022-03-08 00:00:00)

## 2022-08-25 NOTE — Progress Notes (Signed)
Referring:  Rusty Aus, MD [place] [facility] [place],  [place] [address]  PCP: Rusty Aus, MD  Virtual Visit via Video Note  I connected with April Griffith on 08/25/22 at  3:15 PM EST by a video enabled telemedicine application and verified that I am speaking with the correct person using two identifiers.  Location: Patient: at home Provider:  in office   I discussed the limitations of evaluation and management by telemedicine and the availability of in person appointments. The patient expressed understanding and agreed to proceed.    CC:  facial pain  History provided from self  Follow-up visit  Prior visit: 05/13/2022 with Dr. Billey Gosling   Brief HPI:   April Griffith is a 41 y.o. female with HTN, migraine, anxiety, depression, s/p lumbar fusion who was seen by Dr. Billey Gosling for evaluation of persistent right facial pain and numbness which began around 01/2022. MR brain unremarkable.  She also noted daily migraines and severe neck pain without radiculopathy.  At prior visit, referred to PT and started on Flexeril but denies benefit.  MRA head ordered but not yet completed.   Interval history:  Visit transitioned from in office visit to virtual visit as she and her family are recovering from the flu.  Completed PT who felt symptoms related to trigeminal neuralgia, did have some improvement of neck pain working with PT but right facial pain persists. Reports PT suspected symptoms related to trigeminal neuralgia.  Reports pain is constant, worse when waking in the morning, has greatly impacted her daily routine and activity.  Denies any benefit of Flexeril.  Brushing her teeth can occasionally be bothersome but no other specific triggers.  She also continues to have frequent migraines but feels it is radiating from her facial pain.       LABS: CBC    Component Value Date/Time   WBC 12.7 (H) 10/01/2019 0941   RBC 4.85 10/01/2019  0941   HGB 14.1 10/01/2019 0941   HCT 41.2 10/01/2019 0941   PLT 433 10/01/2019 0941   MCV 85 10/01/2019 0941   MCH 29.1 10/01/2019 0941   MCHC 34.2 10/01/2019 0941   RDW 12.7 10/01/2019 0941   LYMPHSABS 2.0 10/01/2019 0941   EOSABS 0.0 10/01/2019 0941   BASOSABS 0.0 10/01/2019 0941      Latest Ref Rng & Units 10/01/2019    9:41 AM  CMP  Glucose 65 - 99 mg/dL 135   BUN 6 - 20 mg/dL 11   Creatinine 0.57 - 1.00 mg/dL 0.72   Sodium 134 - 144 mmol/L 140   Potassium 3.5 - 5.2 mmol/L 4.1   Chloride 96 - 106 mmol/L 103   Calcium 8.7 - 10.2 mg/dL 9.7   Total Protein 6.0 - 8.5 g/dL 7.3   Total Bilirubin 0.0 - 1.2 mg/dL 0.6   Alkaline Phos 39 - 117 IU/L 69   AST 0 - 40 IU/L 14   ALT 0 - 32 IU/L 17      IMAGING:  MRI brain 02/2022: unremarkable  Imaging independently reviewed 05/13/22  Current Outpatient Medications on File Prior to Visit  Medication Sig Dispense Refill   cyclobenzaprine (FLEXERIL) 5 MG tablet Take 1 tablet (5 mg total) by mouth 3 (three) times daily as needed for muscle spasms. 30 tablet 6   HYDROcodone-acetaminophen (NORCO) 10-325 MG tablet as needed.     losartan-hydrochlorothiazide (HYZAAR) 50-12.5 MG tablet Take 1 tablet by mouth daily.  omeprazole (PRILOSEC) 40 MG capsule Take 1 capsule by mouth daily.     ondansetron (ZOFRAN-ODT) 4 MG disintegrating tablet Take 4 mg by mouth every 8 (eight) hours as needed. (Patient not taking: Reported on 05/13/2022)     propranolol (INDERAL) 80 MG tablet Take 1 tablet (80 mg total) by mouth daily. 90 tablet 3   rizatriptan (MAXALT) 5 MG tablet TAKE 1 TABLET BY MOUTH EVERY DAY AS NEEDED FOR MIGRAINE (Patient not taking: Reported on 05/13/2022) 30 tablet 2   sodium chloride (OCEAN) 0.65 % SOLN nasal spray Place 2 sprays into both nostrils every 2 (two) hours while awake.  0   traMADol (ULTRAM) 50 MG tablet as needed.     traZODone (DESYREL) 50 MG tablet TAKE 1 TO 2 TABLETS AT BEDTIME AS NEEDED (Patient taking differently: as  needed. TAKE 1 TO 2 TABLETS AT BEDTIME AS NEEDED) 60 tablet 1   zolpidem (AMBIEN) 10 MG tablet Take 10 mg by mouth at bedtime as needed.     No current facility-administered medications on file prior to visit.     Allergies: Allergies  Allergen Reactions   Tape Rash   Aluminum Chlorohydrate Itching   Cefixime Hives   Fentanyl Itching    Other reaction(s): Unknown   Olmesartan Medoxomil-Hctz Other (See Comments)    Family History: Migraine or other headaches in the family:  mother has migraines Aneurysms in a first degree relative:  no Brain tumors in the family:  no Other neurological illness in the family:   mother has essential tremor  Past Medical History: Past Medical History:  Diagnosis Date   Facial numbness    Headache    Hypertension    Migraine    Paresthesias     Past Surgical History Past Surgical History:  Procedure Laterality Date   BREAST BIOPSY Left 06/13/2011   benign done at Dr. Rutherford Nail office   BREAST EXCISIONAL BIOPSY     CESAREAN SECTION  2010   SPINAL FUSION N/A    L4 - S1 (Has had spinal fusion surgery 3 times)    Social History: Social History   Tobacco Use   Smoking status: Never   Smokeless tobacco: Never  Substance Use Topics   Alcohol use: Never   Drug use: Never    ROS: Negative for fevers, chills. Positive for headaches, facial numbness. All other systems reviewed and negative unless stated otherwise in HPI.     IMPRESSION: 41 year old female with a history of HTN, migraine, anxiety, depression, s/p lumbar fusion who presents for evaluation of headaches, ear pressure, and right facial numbness.  Evaluated by Dr. Delena Bali on 05/13/2022.  Brain MRI was unremarkable.  No significant benefit with PT or Flexeril.  As pain has persisted and significantly impacting quality of life, she would like to start on a daily preventative medication.   PLAN: -Recommend starting carbamazepine 100 mg twice daily, advised to call after 1 to 2  weeks if no benefit or sooner if difficulty tolerating -If symptoms improve, May need to consider migraine prophylaxis if migraines persist despite improvement of facial pain -If symptoms persist at follow-up visit or no benefit with medication, will consider proceeding with MRI head as previously ordered but will hold off at this time due to significant amount of pain   Follow-up in 3 months or call earlier if needed   I spent 15 minutes of face-to-face and non-face-to-face time with patient via MyChart video visit.  This included previsit chart review, lab review,  study review, order entry, electronic health record documentation, patient education and discussion regarding the above and answered all the questions to patient satisfaction  Frann Rider, [facility]  [facility] [address] [place], [place] [address]  Phone [phone] Fax [phone] Note: This document was prepared with digital dictation and possible smart phrase technology. Any transcriptional errors that result from this process are unintentional.

## 2022-09-21 DIAGNOSIS — G894 Chronic pain syndrome: Secondary | ICD-10-CM | POA: Diagnosis not present

## 2022-09-21 DIAGNOSIS — M961 Postlaminectomy syndrome, not elsewhere classified: Secondary | ICD-10-CM | POA: Diagnosis not present

## 2022-09-21 DIAGNOSIS — M545 Low back pain, unspecified: Secondary | ICD-10-CM | POA: Diagnosis not present

## 2022-09-21 DIAGNOSIS — M5136 Other intervertebral disc degeneration, lumbar region: Secondary | ICD-10-CM | POA: Diagnosis not present

## 2022-09-21 DIAGNOSIS — Z79891 Long term (current) use of opiate analgesic: Secondary | ICD-10-CM | POA: Diagnosis not present

## 2022-09-21 DIAGNOSIS — R519 Headache, unspecified: Secondary | ICD-10-CM | POA: Diagnosis not present

## 2022-10-19 DIAGNOSIS — G894 Chronic pain syndrome: Secondary | ICD-10-CM | POA: Diagnosis not present

## 2022-10-19 DIAGNOSIS — M5136 Other intervertebral disc degeneration, lumbar region: Secondary | ICD-10-CM | POA: Diagnosis not present

## 2022-10-19 DIAGNOSIS — G500 Trigeminal neuralgia: Secondary | ICD-10-CM | POA: Diagnosis not present

## 2022-10-19 DIAGNOSIS — M545 Low back pain, unspecified: Secondary | ICD-10-CM | POA: Diagnosis not present

## 2022-10-19 DIAGNOSIS — M961 Postlaminectomy syndrome, not elsewhere classified: Secondary | ICD-10-CM | POA: Diagnosis not present

## 2022-10-19 DIAGNOSIS — Z79891 Long term (current) use of opiate analgesic: Secondary | ICD-10-CM | POA: Diagnosis not present

## 2022-10-19 DIAGNOSIS — R519 Headache, unspecified: Secondary | ICD-10-CM | POA: Diagnosis not present

## 2022-11-14 ENCOUNTER — Telehealth: Payer: Self-pay | Admitting: Adult Health

## 2022-11-14 NOTE — Telephone Encounter (Signed)
..   Pt understands that although there may be some limitations with this type of visit, we will take all precautions to reduce any security or privacy concerns.  Pt understands that this will be treated like an in office visit and we will file with pt's insurance, and there may be a patient responsible charge related to this service. ? ?

## 2022-11-16 ENCOUNTER — Encounter: Payer: Self-pay | Admitting: Adult Health

## 2022-11-16 ENCOUNTER — Telehealth (INDEPENDENT_AMBULATORY_CARE_PROVIDER_SITE_OTHER): Payer: 59 | Admitting: Adult Health

## 2022-11-16 DIAGNOSIS — G509 Disorder of trigeminal nerve, unspecified: Secondary | ICD-10-CM | POA: Diagnosis not present

## 2022-11-16 NOTE — Patient Instructions (Addendum)
Recommend trying lamotrigine for persistent facial pain  This medication will be gradually increased to a total of 100mg  twice daily Gradual titration below:  Week 1: 25 mg twice daily Week 2: 50 mg twice daily Week 3: 75 mg twice daily Week 4: 100 mg twice daily  Please send me a MyChart message or call office if no benefit after a couple weeks on the 100mg  twice daily dosing or sooner if any difficulty tolerating    Follow-up in 4 months

## 2022-11-16 NOTE — Progress Notes (Signed)
Referring:  Rusty Aus, MD [facility] [place],  [place] [address]  PCP: Rusty Aus, MD  Virtual Visit via Video Note  I connected with Monica Becton on 11/16/22 at 12:45 PM EDT by a video enabled telemedicine application and verified that I am speaking with the correct person using two identifiers.  Location: Patient: in car Provider:  at home   I discussed the limitations of evaluation and management by telemedicine and the availability of in person appointments. The patient expressed understanding and agreed to proceed.    CC:  facial pain  History provided from self  Follow-up visit  Prior visit: 08/25/2022   Brief HPI:   April Griffith is a 42 y.o. female with HTN, migraine, anxiety, depression, s/p lumbar fusion who was seen by Dr. Billey Gosling for evaluation of persistent right facial pain and numbness which began around 01/2022 consistent with trigeminal neuralgia. MR brain 02/2022 unremarkable.  She also noted daily migraines and severe neck pain without radiculopathy.  At prior visit, she was started on carbamazepine for persistent facial pain.  Noted some improvement of cervicalgia working with PT.  Interval history:  Unfortunately, had great difficulty tolerating carbamazepine due to diarrhea and excessive daytime fatigue. Use of medication for 6 weeks prior to stopping. Denies any benefit in regards to facial pain while taking.  Symptoms remain persistent, more so affecting right ear and can radiate down into jaw line. Pain worse at night and has been waking up with migraines.  She has previously been on gabapentin for back pain and unable to tolerate.  She has previously tried muscle relaxant but no benefit.      LABS: CBC    Component Value Date/Time   WBC 12.7 (H) 10/01/2019 0941   RBC 4.85 10/01/2019 0941   HGB 14.1 10/01/2019 0941   HCT 41.2 10/01/2019 0941   PLT 433 10/01/2019 0941   MCV 85 10/01/2019  0941   MCH 29.1 10/01/2019 0941   MCHC 34.2 10/01/2019 0941   RDW 12.7 10/01/2019 0941   LYMPHSABS 2.0 10/01/2019 0941   EOSABS 0.0 10/01/2019 0941   BASOSABS 0.0 10/01/2019 0941      Latest Ref Rng & Units 10/01/2019    9:41 AM  CMP  Glucose 65 - 99 mg/dL 135   BUN 6 - 20 mg/dL 11   Creatinine 0.57 - 1.00 mg/dL 0.72   Sodium 134 - 144 mmol/L 140   Potassium 3.5 - 5.2 mmol/L 4.1   Chloride 96 - 106 mmol/L 103   Calcium 8.7 - 10.2 mg/dL 9.7   Total Protein 6.0 - 8.5 g/dL 7.3   Total Bilirubin 0.0 - 1.2 mg/dL 0.6   Alkaline Phos 39 - 117 IU/L 69   AST 0 - 40 IU/L 14   ALT 0 - 32 IU/L 17      IMAGING:  MRI brain 02/2022: unremarkable  Imaging independently reviewed 05/13/22  Current Outpatient Medications on File Prior to Visit  Medication Sig Dispense Refill   carbamazepine (CARBATROL) 100 MG 12 hr capsule Take 1 capsule (100 mg total) by mouth 2 (two) times daily. 60 capsule 5   cyclobenzaprine (FLEXERIL) 5 MG tablet Take 1 tablet (5 mg total) by mouth 3 (three) times daily as needed for muscle spasms. 30 tablet 6   HYDROcodone-acetaminophen (NORCO) 10-325 MG tablet as needed.     losartan-hydrochlorothiazide (HYZAAR) 50-12.5 MG tablet Take 1 tablet by mouth daily.  omeprazole (PRILOSEC) 40 MG capsule Take 1 capsule by mouth daily.     ondansetron (ZOFRAN-ODT) 4 MG disintegrating tablet Take 4 mg by mouth every 8 (eight) hours as needed. (Patient not taking: Reported on 05/13/2022)     propranolol (INDERAL) 80 MG tablet Take 1 tablet (80 mg total) by mouth daily. 90 tablet 3   rizatriptan (MAXALT) 5 MG tablet TAKE 1 TABLET BY MOUTH EVERY DAY AS NEEDED FOR MIGRAINE (Patient not taking: Reported on 05/13/2022) 30 tablet 2   sodium chloride (OCEAN) 0.65 % SOLN nasal spray Place 2 sprays into both nostrils every 2 (two) hours while awake.  0   traMADol (ULTRAM) 50 MG tablet as needed.     traZODone (DESYREL) 50 MG tablet TAKE 1 TO 2 TABLETS AT BEDTIME AS NEEDED (Patient taking  differently: as needed. TAKE 1 TO 2 TABLETS AT BEDTIME AS NEEDED) 60 tablet 1   zolpidem (AMBIEN) 10 MG tablet Take 10 mg by mouth at bedtime as needed.     No current facility-administered medications on file prior to visit.     Allergies: Allergies  Allergen Reactions   Tape Rash   Aluminum Chlorohydrate Itching   Cefixime Hives   Fentanyl Itching    Other reaction(s): Unknown   Olmesartan Medoxomil-Hctz Other (See Comments)    Family History: Migraine or other headaches in the family:  mother has migraines Aneurysms in a first degree relative:  no Brain tumors in the family:  no Other neurological illness in the family:   mother has essential tremor  Past Medical History: Past Medical History:  Diagnosis Date   Facial numbness    Headache    Hypertension    Migraine    Paresthesias     Past Surgical History Past Surgical History:  Procedure Laterality Date   BREAST BIOPSY Left 06/13/2011   benign done at Dr. Dwyane Luo office   BREAST EXCISIONAL BIOPSY     CESAREAN SECTION  2010   SPINAL FUSION N/A    L4 - S1 (Has had spinal fusion surgery 3 times)    Social History: Social History   Tobacco Use   Smoking status: Never   Smokeless tobacco: Never  Substance Use Topics   Alcohol use: Never   Drug use: Never    ROS: Negative for fevers, chills. Positive for headaches, facial numbness. All other systems reviewed and negative unless stated otherwise in HPI.        IMPRESSION: 42 year old female with a history of HTN, migraine, anxiety, depression, s/p lumbar fusion who presents for evaluation of headaches, ear pressure, and right facial numbness likely due to trigeminal neuralgia.  Evaluated by Dr. Billey Gosling on 05/13/2022.  Brain MRI was unremarkable.  No significant benefit with PT or Flexeril.  Unable to tolerate carbamazepine, no benefit with muscle relaxants and prior intolerance to gabapentin.   PLAN: -Recommend trialing lamotrigine with gradual  titration to 100 mg twice daily, advised to call after 1 to 2 weeks if no benefit or sooner if any difficulty tolerating -will discuss if further imaging is needed with Dr. Billey Gosling such as MRI IAC or MRA     Follow-up in 4 months or call earlier if needed   I spent 15 minutes of face-to-face and non-face-to-face time with patient via [place] video visit.  This included previsit chart review, lab review, study review, order entry, electronic health record documentation, patient education and discussion regarding the above and answered all the questions to patient satisfaction  Janett Billow  Caryn Section  [facility] [address] [place], [place] [address]  Phone [phone] Fax [phone] Note: This document was prepared with digital dictation and possible smart phrase technology. Any transcriptional errors that result from this process are unintentional.

## 2022-11-21 ENCOUNTER — Other Ambulatory Visit: Payer: Self-pay | Admitting: Adult Health

## 2022-11-21 ENCOUNTER — Encounter: Payer: Self-pay | Admitting: Adult Health

## 2022-11-21 DIAGNOSIS — G509 Disorder of trigeminal nerve, unspecified: Secondary | ICD-10-CM

## 2022-11-22 ENCOUNTER — Telehealth: Payer: Self-pay | Admitting: Adult Health

## 2022-11-22 NOTE — Telephone Encounter (Signed)
April Griffith exp. 11/22/22-05/21/23 sent to [place]

## 2022-12-15 DIAGNOSIS — M545 Low back pain, unspecified: Secondary | ICD-10-CM | POA: Diagnosis not present

## 2022-12-15 DIAGNOSIS — M5136 Other intervertebral disc degeneration, lumbar region: Secondary | ICD-10-CM | POA: Diagnosis not present

## 2022-12-15 DIAGNOSIS — Z79891 Long term (current) use of opiate analgesic: Secondary | ICD-10-CM | POA: Diagnosis not present

## 2022-12-15 DIAGNOSIS — G500 Trigeminal neuralgia: Secondary | ICD-10-CM | POA: Diagnosis not present

## 2022-12-15 DIAGNOSIS — M961 Postlaminectomy syndrome, not elsewhere classified: Secondary | ICD-10-CM | POA: Diagnosis not present

## 2022-12-15 DIAGNOSIS — R519 Headache, unspecified: Secondary | ICD-10-CM | POA: Diagnosis not present

## 2022-12-15 DIAGNOSIS — G894 Chronic pain syndrome: Secondary | ICD-10-CM | POA: Diagnosis not present

## 2023-01-17 DIAGNOSIS — G894 Chronic pain syndrome: Secondary | ICD-10-CM | POA: Diagnosis not present

## 2023-01-17 DIAGNOSIS — R519 Headache, unspecified: Secondary | ICD-10-CM | POA: Diagnosis not present

## 2023-01-17 DIAGNOSIS — M961 Postlaminectomy syndrome, not elsewhere classified: Secondary | ICD-10-CM | POA: Diagnosis not present

## 2023-01-17 DIAGNOSIS — G500 Trigeminal neuralgia: Secondary | ICD-10-CM | POA: Diagnosis not present

## 2023-01-17 DIAGNOSIS — M545 Low back pain, unspecified: Secondary | ICD-10-CM | POA: Diagnosis not present

## 2023-01-17 DIAGNOSIS — Z79891 Long term (current) use of opiate analgesic: Secondary | ICD-10-CM | POA: Diagnosis not present

## 2023-01-17 DIAGNOSIS — M5136 Other intervertebral disc degeneration, lumbar region: Secondary | ICD-10-CM | POA: Diagnosis not present

## 2023-02-03 DIAGNOSIS — Z0000 Encounter for general adult medical examination without abnormal findings: Secondary | ICD-10-CM | POA: Diagnosis not present

## 2023-02-03 DIAGNOSIS — R7401 Elevation of levels of liver transaminase levels: Secondary | ICD-10-CM | POA: Diagnosis not present

## 2023-02-03 DIAGNOSIS — Z13220 Encounter for screening for lipoid disorders: Secondary | ICD-10-CM | POA: Diagnosis not present

## 2023-02-13 DIAGNOSIS — G4733 Obstructive sleep apnea (adult) (pediatric): Secondary | ICD-10-CM | POA: Diagnosis not present

## 2023-02-13 DIAGNOSIS — Z0000 Encounter for general adult medical examination without abnormal findings: Secondary | ICD-10-CM | POA: Diagnosis not present

## 2023-02-23 DIAGNOSIS — M5136 Other intervertebral disc degeneration, lumbar region: Secondary | ICD-10-CM | POA: Diagnosis not present

## 2023-02-23 DIAGNOSIS — G894 Chronic pain syndrome: Secondary | ICD-10-CM | POA: Diagnosis not present

## 2023-02-23 DIAGNOSIS — R519 Headache, unspecified: Secondary | ICD-10-CM | POA: Diagnosis not present

## 2023-02-23 DIAGNOSIS — M961 Postlaminectomy syndrome, not elsewhere classified: Secondary | ICD-10-CM | POA: Diagnosis not present

## 2023-02-23 DIAGNOSIS — G500 Trigeminal neuralgia: Secondary | ICD-10-CM | POA: Diagnosis not present

## 2023-02-23 DIAGNOSIS — Z79891 Long term (current) use of opiate analgesic: Secondary | ICD-10-CM | POA: Diagnosis not present

## 2023-02-23 DIAGNOSIS — M545 Low back pain, unspecified: Secondary | ICD-10-CM | POA: Diagnosis not present

## 2023-03-22 ENCOUNTER — Ambulatory Visit: Payer: 59 | Admitting: Adult Health

## 2023-03-22 DIAGNOSIS — G894 Chronic pain syndrome: Secondary | ICD-10-CM | POA: Diagnosis not present

## 2023-03-22 DIAGNOSIS — R519 Headache, unspecified: Secondary | ICD-10-CM | POA: Diagnosis not present

## 2023-03-22 DIAGNOSIS — M961 Postlaminectomy syndrome, not elsewhere classified: Secondary | ICD-10-CM | POA: Diagnosis not present

## 2023-03-22 DIAGNOSIS — Z79891 Long term (current) use of opiate analgesic: Secondary | ICD-10-CM | POA: Diagnosis not present

## 2023-03-22 DIAGNOSIS — G500 Trigeminal neuralgia: Secondary | ICD-10-CM | POA: Diagnosis not present

## 2023-03-22 DIAGNOSIS — M545 Low back pain, unspecified: Secondary | ICD-10-CM | POA: Diagnosis not present

## 2023-03-22 DIAGNOSIS — M5136 Other intervertebral disc degeneration, lumbar region: Secondary | ICD-10-CM | POA: Diagnosis not present

## 2023-04-07 DIAGNOSIS — G4733 Obstructive sleep apnea (adult) (pediatric): Secondary | ICD-10-CM | POA: Diagnosis not present

## 2023-04-10 ENCOUNTER — Other Ambulatory Visit: Payer: Self-pay | Admitting: Internal Medicine

## 2023-04-10 DIAGNOSIS — G4733 Obstructive sleep apnea (adult) (pediatric): Secondary | ICD-10-CM | POA: Diagnosis not present

## 2023-04-10 DIAGNOSIS — Z1231 Encounter for screening mammogram for malignant neoplasm of breast: Secondary | ICD-10-CM

## 2023-04-21 ENCOUNTER — Ambulatory Visit
Admission: RE | Admit: 2023-04-21 | Discharge: 2023-04-21 | Disposition: A | Discharge status: Home / Self Care | Payer: 59 | Source: Ambulatory Visit | Attending: Internal Medicine | Admitting: Internal Medicine

## 2023-04-21 DIAGNOSIS — Z1231 Encounter for screening mammogram for malignant neoplasm of breast: Secondary | ICD-10-CM | POA: Diagnosis not present

## 2023-04-24 ENCOUNTER — Ambulatory Visit: Payer: 59 | Admitting: Adult Health

## 2023-04-26 DIAGNOSIS — Z79891 Long term (current) use of opiate analgesic: Secondary | ICD-10-CM | POA: Diagnosis not present

## 2023-04-26 DIAGNOSIS — R519 Headache, unspecified: Secondary | ICD-10-CM | POA: Diagnosis not present

## 2023-04-26 DIAGNOSIS — M545 Low back pain, unspecified: Secondary | ICD-10-CM | POA: Diagnosis not present

## 2023-04-26 DIAGNOSIS — M961 Postlaminectomy syndrome, not elsewhere classified: Secondary | ICD-10-CM | POA: Diagnosis not present

## 2023-04-26 DIAGNOSIS — G500 Trigeminal neuralgia: Secondary | ICD-10-CM | POA: Diagnosis not present

## 2023-04-26 DIAGNOSIS — G894 Chronic pain syndrome: Secondary | ICD-10-CM | POA: Diagnosis not present

## 2023-04-26 DIAGNOSIS — M5136 Other intervertebral disc degeneration, lumbar region: Secondary | ICD-10-CM | POA: Diagnosis not present

## 2023-06-09 ENCOUNTER — Emergency Department
Admission: EM | Admit: 2023-06-09 | Discharge: 2023-06-10 | Disposition: A | Discharge status: Home / Self Care | Payer: 59 | Attending: Emergency Medicine | Admitting: Emergency Medicine

## 2023-06-09 ENCOUNTER — Other Ambulatory Visit: Payer: Self-pay

## 2023-06-09 ENCOUNTER — Emergency Department: Payer: 59

## 2023-06-09 DIAGNOSIS — X501XXA Overexertion from prolonged static or awkward postures, initial encounter: Secondary | ICD-10-CM | POA: Diagnosis not present

## 2023-06-09 DIAGNOSIS — S99911A Unspecified injury of right ankle, initial encounter: Secondary | ICD-10-CM | POA: Diagnosis present

## 2023-06-09 DIAGNOSIS — S93401A Sprain of unspecified ligament of right ankle, initial encounter: Secondary | ICD-10-CM | POA: Diagnosis not present

## 2023-06-09 NOTE — ED Triage Notes (Signed)
Pt presents to ER from home, reports she tripped and twisted her right ankle. Pt reports swelling and pain. Pt talks in complete sentences no respiratory distress noted

## 2023-06-09 NOTE — ED Notes (Addendum)
Ice placed. Pt has obvious discoloration to foot.

## 2023-06-10 NOTE — ED Notes (Signed)
Discharge instructions reviewed with patient. Patient questions answered and opportunity for education reviewed. Patient voices understanding of discharge instructions with no further questions. Patient to lobby via wheelchair. 

## 2023-06-10 NOTE — ED Provider Notes (Signed)
[facility] [facility] DE[facility]MENT AT [facility] Provider Note   CSN: [id number] Arrival date & time: 06/09/23  1920     History  Chief Complaint  Patient presents with   Ankle Pain    April Griffith is a 42 y.o. female.  Presents to the emergency department for evaluation of right ankle pain.  Patient states just prior to arrival she rolled her right ankle, felt a pop along the lateral aspect along the ATFL region.  Has had some history of prior ankle fractures and injury over the years.  She is having a hard time bearing weight.  Denies any medial ankle discomfort.  No proximal tib-fib discomfort.  HPI     Home Medications Prior to Admission medications   Medication Sig Start Date End Date Taking? Authorizing Provider  carbamazepine (CARBATROL) 100 MG 12 hr capsule Take 1 capsule (100 mg total) by mouth 2 (two) times daily. 08/25/22   Ihor Austin, NP  cyclobenzaprine (FLEXERIL) 5 MG tablet Take 1 tablet (5 mg total) by mouth 3 (three) times daily as needed for muscle spasms. 05/13/22   Ocie Doyne, MD  HYDROcodone-acetaminophen (NORCO) 10-325 MG tablet as needed. 03/22/19   [provider]  losartan-hydrochlorothiazide (HYZAAR) 50-12.5 MG tablet Take 1 tablet by mouth daily. 02/02/22 02/02/23  [provider]  omeprazole (PRILOSEC) 40 MG capsule Take 1 capsule by mouth daily. 02/22/22   [provider]  ondansetron (ZOFRAN-ODT) 4 MG disintegrating tablet Take 4 mg by mouth every 8 (eight) hours as needed. Patient not taking: Reported on 05/13/2022 07/29/19   [provider]  propranolol (INDERAL) 80 MG tablet Take 1 tablet (80 mg total) by mouth daily. 10/10/19 10/09/20  Betancourt, Jarold Song, NP  rizatriptan (MAXALT) 5 MG tablet TAKE 1 TABLET BY MOUTH EVERY DAY AS NEEDED FOR MIGRAINE Patient not taking: Reported on 05/13/2022 11/30/20   Koa Reining, PA-C  sodium chloride (OCEAN) 0.65 % SOLN nasal spray Place 2 sprays into both nostrils every  2 (two) hours while awake. 10/09/19 11/08/19  Betancourt, Jarold Song, NP  traMADol (ULTRAM) 50 MG tablet as needed. 12/01/21   [provider]  traZODone (DESYREL) 50 MG tablet TAKE 1 TO 2 TABLETS AT BEDTIME AS NEEDED Patient taking differently: as needed. TAKE 1 TO 2 TABLETS AT BEDTIME AS NEEDED 11/26/19   Jem Reining, PA-C  zolpidem (AMBIEN) 10 MG tablet Take 10 mg by mouth at bedtime as needed. 04/19/22   [provider]      Allergies    Tape, Aluminum chlorohydrate, Cefixime, Fentanyl, and Olmesartan medoxomil-hctz    Review of Systems   Review of Systems  Physical Exam Updated Vital Signs BP (!) 142/96 (BP Location: Left Arm)   Pulse 87   Temp 98.2 F (36.8 C)   Resp 18   Ht 5\' 3"  (1.6 m)   Wt 74.8 kg   LMP 05/19/2023 (Approximate)   SpO2 95%   BMI 29.23 kg/m  Physical Exam Constitutional:      Appearance: She is well-developed.  HENT:     Head: Normocephalic and atraumatic.  Eyes:     Conjunctiva/sclera: Conjunctivae normal.  Cardiovascular:     Rate and Rhythm: Normal rate.  Pulmonary:     Effort: Pulmonary effort is normal. No respiratory distress.  Musculoskeletal:        General: Normal range of motion.     Cervical back: Normal range of motion.     Comments: Right lower extremity with no edema,  skin breakdown noted.  She is tender with mild swelling on the lateral ATFL region.  Mild ecchymosis noted.  Very minimally tender along the base of the fifth metatarsal mostly tender along the ATFL.  No medial malleolus or deltoid ligament tenderness.  Distal nontender to palpation.  No proximal tib-fib discomfort.  2+ dorsalis pedis pulses.  Compartments are soft.  Skin:    General: Skin is warm.     Findings: No rash.  Neurological:     Mental Status: She is alert and oriented to person, place, and time.  Psychiatric:        Behavior: Behavior normal.        Thought Content: Thought content normal.     ED Results / Procedures / Treatments    Labs (all labs ordered are listed, but only abnormal results are displayed) Labs Reviewed - No data to display  EKG None  Radiology DG Ankle Complete Right  Result Date: 06/09/2023 CLINICAL DATA:  Status post fall. EXAM: RIGHT ANKLE - COMPLETE 3+ VIEW COMPARISON:  None Available. FINDINGS: A 1.6 cm x 1.0 cm x 1.0 cm os vesalianum pedis is seen adjacent to the base of the fifth right metatarsal. A very small, ill-defined area of cortical irregularity is seen along the adjacent portion of the base of the fifth right metatarsal. This is of indeterminate age. There is no evidence of dislocation or significant arthropathy. Soft tissues are unremarkable. IMPRESSION: Findings which may represent a small avulsion fracture of indeterminate age involving the base of the fifth right metatarsal. Correlation with physical examination is recommended to determine the presence of point tenderness. Electronically Signed   By: Aram Candela M.D.   On: 06/09/2023 22:09    Procedures Procedures    Medications Ordered in ED Medications - No data to display  ED Course/ Medical Decision Making/ A&P                                 Medical Decision Making Amount and/or Complexity of Data Reviewed Radiology: ordered.   42 year old female with right ankle injury.  She describes a ATFL injury to the lateral aspect of the ankle with exam findings consistent with ATFL ankle sprain.  She has very minimal tenderness to the base of the fifth metatarsal where x-rays show a tiny avulsion.  She has a history of prior ankle injuries.  I suspect fifth metatarsal ankle avulsion is chronic based on physical exam findings.  Will treat in a walking boot and have her stay nonweightbearing.  She has crutches at home.  She will alternate Tylenol and ibuprofen.  She will follow-up with orthopedist/podiatry. Final Clinical Impression(s) / ED Diagnoses Final diagnoses:  Severe ankle sprain, right, initial encounter    Rx /  DC Orders ED Discharge Orders     None         Ronnette Juniper 06/10/23 0007    Corena Herter, MD 06/12/23 1331

## 2023-06-10 NOTE — ED Provider Notes (Incomplete)
[place] EMERGENCY DEPARTMENT AT [facility] REGIONAL Provider Note   CSN: [id number] Arrival date & time: 06/09/23  1920     History {Add pertinent medical, surgical, social history, OB history to HPI:1} Chief Complaint  Patient presents with  . Ankle Pain    April Griffith is a 42 y.o. female.  HPI     Home Medications Prior to Admission medications   Medication Sig Start Date End Date Taking? Authorizing Provider  carbamazepine (CARBATROL) 100 MG 12 hr capsule Take 1 capsule (100 mg total) by mouth 2 (two) times daily. 08/25/22   Ihor Austin, NP  cyclobenzaprine (FLEXERIL) 5 MG tablet Take 1 tablet (5 mg total) by mouth 3 (three) times daily as needed for muscle spasms. 05/13/22   Ocie Doyne, MD  HYDROcodone-acetaminophen (NORCO) 10-325 MG tablet as needed. 03/22/19   [provider]  losartan-hydrochlorothiazide (HYZAAR) 50-12.5 MG tablet Take 1 tablet by mouth daily. 02/02/22 02/02/23  [provider]  omeprazole (PRILOSEC) 40 MG capsule Take 1 capsule by mouth daily. 02/22/22   [provider]  ondansetron (ZOFRAN-ODT) 4 MG disintegrating tablet Take 4 mg by mouth every 8 (eight) hours as needed. Patient not taking: Reported on 05/13/2022 07/29/19   [provider]  propranolol (INDERAL) 80 MG tablet Take 1 tablet (80 mg total) by mouth daily. 10/10/19 10/09/20  Betancourt, Jarold Song, NP  rizatriptan (MAXALT) 5 MG tablet TAKE 1 TABLET BY MOUTH EVERY DAY AS NEEDED FOR MIGRAINE Patient not taking: Reported on 05/13/2022 11/30/20   Trudee Reining, PA-C  sodium chloride (OCEAN) 0.65 % SOLN nasal spray Place 2 sprays into both nostrils every 2 (two) hours while awake. 10/09/19 11/08/19  Betancourt, Jarold Song, NP  traMADol (ULTRAM) 50 MG tablet as needed. 12/01/21   [provider]  traZODone (DESYREL) 50 MG tablet TAKE 1 TO 2 TABLETS AT BEDTIME AS NEEDED Patient taking differently: as needed. TAKE 1 TO 2 TABLETS AT BEDTIME AS NEEDED 11/26/19    Shaquan Reining, PA-C  zolpidem (AMBIEN) 10 MG tablet Take 10 mg by mouth at bedtime as needed. 04/19/22   [provider]      Allergies    Tape, Aluminum chlorohydrate, Cefixime, Fentanyl, and Olmesartan medoxomil-hctz    Review of Systems   Review of Systems  Physical Exam Updated Vital Signs BP (!) 142/96 (BP Location: Left Arm)   Pulse 87   Temp 98.2 F (36.8 C)   Resp 18   Ht 5\' 3"  (1.6 m)   Wt 74.8 kg   LMP 05/19/2023 (Approximate)   SpO2 95%   BMI 29.23 kg/m  Physical Exam  ED Results / Procedures / Treatments   Labs (all labs ordered are listed, but only abnormal results are displayed) Labs Reviewed - No data to display  EKG None  Radiology DG Ankle Complete Right  Result Date: 06/09/2023 CLINICAL DATA:  Status post fall. EXAM: RIGHT ANKLE - COMPLETE 3+ VIEW COMPARISON:  None Available. FINDINGS: A 1.6 cm x 1.0 cm x 1.0 cm os vesalianum pedis is seen adjacent to the base of the fifth right metatarsal. A very small, ill-defined area of cortical irregularity is seen along the adjacent portion of the base of the fifth right metatarsal. This is of indeterminate age. There is no evidence of dislocation or significant arthropathy. Soft tissues are unremarkable. IMPRESSION: Findings which may represent a small avulsion fracture of indeterminate age involving the base of the fifth right metatarsal. Correlation with physical examination is recommended to  determine the presence of point tenderness. Electronically Signed   By: Aram Candela M.D.   On: 06/09/2023 22:09    Procedures Procedures  {Document cardiac monitor, telemetry assessment procedure when appropriate:1}  Medications Ordered in ED Medications - No data to display  ED Course/ Medical Decision Making/ A&P   {   Click here for ABCD2, HEART and other calculatorsREFRESH Note before signing :1}                              Medical Decision Making Amount and/or Complexity of Data  Reviewed Radiology: ordered.   ***  {Document critical care time when appropriate:1} {Document review of labs and clinical decision tools ie heart score, Chads2Vasc2 etc:1}  {Document your independent review of radiology images, and any outside records:1} {Document your discussion with family members, caretakers, and with consultants:1} {Document social determinants of health affecting pt's care:1} {Document your decision making why or why not admission, treatments were needed:1} Final Clinical Impression(s) / ED Diagnoses Final diagnoses:  Severe ankle sprain, right, initial encounter    Rx / DC Orders ED Discharge Orders     None

## 2023-06-10 NOTE — Discharge Instructions (Signed)
Please rest ice and elevate the right lower extremity.  You may alternate Tylenol and ibuprofen as needed for pain.  Use crutches as needed for ambulation until you are able to walk without a limp.  Call orthopedic office on Monday to schedule follow-up appointment for 1 to 2 weeks for recheck.

## 2023-08-15 ENCOUNTER — Ambulatory Visit: Payer: 59 | Admitting: Adult Health

## 2023-08-15 ENCOUNTER — Encounter: Payer: Self-pay | Admitting: Adult Health

## 2023-08-15 ENCOUNTER — Telehealth: Payer: 59 | Admitting: Adult Health

## 2023-08-15 DIAGNOSIS — G509 Disorder of trigeminal nerve, unspecified: Secondary | ICD-10-CM | POA: Diagnosis not present

## 2023-08-15 MED ORDER — LIDOCAINE-PRILOCAINE 2.5-2.5 % EX CREA: 1.0000 | TOPICAL_CREAM | CUTANEOUS | 11 refills | Status: AC | PRN

## 2023-08-15 NOTE — Addendum Note (Signed)
Addended by: Ihor Austin L on: 08/15/2023 03:25 PM   Modules accepted: Orders

## 2023-08-15 NOTE — Patient Instructions (Signed)
Recommend trialing Elma cream as needed for facial pain  Please let me know when you are interested in pursuing potential surgical options in the future - I will place a referral at that time     Follow up as needed at this time   Happy Holidays!

## 2023-08-15 NOTE — Progress Notes (Signed)
Referring:  Danella Penton, MD [phone] [facility] [address] [facility] [place],  [place] [address]  PCP: Danella Penton, MD  Virtual Visit via Video Note  I connected with April Griffith on 08/15/23 at  3:15 PM EST by a video enabled telemedicine application and verified that I am speaking with the correct person using two identifiers.  Location: Patient: at home Provider:  at home   I discussed the limitations of evaluation and management by telemedicine and the availability of in person appointments. The patient expressed understanding and agreed to proceed.   CC:  facial pain  History provided from self  Follow-up visit  Prior visit: 11/16/2022   Brief HPI:   April Griffith is a 42 y.o. female with HTN, migraine, anxiety, depression, s/p lumbar fusion who was seen by Dr. Delena Bali for evaluation of persistent right facial pain and numbness which began around 01/2022 consistent with trigeminal neuralgia. MR brain 02/2022 unremarkable.  She also noted daily migraines and severe neck pain without radiculopathy.  At prior visit, she was started on lamotrigine as unable to tolerate carbamazepine for persistent facial pain.     Interval history:  Was unable to tolerate lamotrigine due to excessive fatigue.  As she is intolerant of multiple oral medications, discussed pursuing surgical options but declines interest at this time as her father was recently diagnosed with dementia and is his primary caregiver.  She reports persistent jaw numbness, can worsen with prolonged smiling or talking.       LABS: CBC    Component Value Date/Time   WBC 12.7 (H) 10/01/2019 0941   RBC 4.85 10/01/2019 0941   HGB 14.1 10/01/2019 0941   HCT 41.2 10/01/2019 0941   PLT 433 10/01/2019 0941   MCV 85 10/01/2019 0941   MCH 29.1 10/01/2019 0941   MCHC 34.2 10/01/2019 0941   RDW 12.7 10/01/2019 0941   LYMPHSABS 2.0 10/01/2019 0941   EOSABS 0.0 10/01/2019 0941   BASOSABS 0.0  10/01/2019 0941      Latest Ref Rng & Units 10/01/2019    9:41 AM  CMP  Glucose 65 - 99 mg/dL 952   BUN 6 - 20 mg/dL 11   Creatinine 8.41 - 1.00 mg/dL 3.24   Sodium 401 - 027 mmol/L 140   Potassium 3.5 - 5.2 mmol/L 4.1   Chloride 96 - 106 mmol/L 103   Calcium 8.7 - 10.2 mg/dL 9.7   Total Protein 6.0 - 8.5 g/dL 7.3   Total Bilirubin 0.0 - 1.2 mg/dL 0.6   Alkaline Phos 39 - 117 IU/L 69   AST 0 - 40 IU/L 14   ALT 0 - 32 IU/L 17      IMAGING:  MRI brain 02/2022: unremarkable  Imaging independently reviewed 05/13/22  Current Outpatient Medications on File Prior to Visit  Medication Sig Dispense Refill   carbamazepine (CARBATROL) 100 MG 12 hr capsule Take 1 capsule (100 mg total) by mouth 2 (two) times daily. 60 capsule 5   cyclobenzaprine (FLEXERIL) 5 MG tablet Take 1 tablet (5 mg total) by mouth 3 (three) times daily as needed for muscle spasms. 30 tablet 6   HYDROcodone-acetaminophen (NORCO) 10-325 MG tablet as needed.     losartan-hydrochlorothiazide (HYZAAR) 50-12.5 MG tablet Take 1 tablet by mouth daily.     omeprazole (PRILOSEC) 40 MG capsule Take 1 capsule by mouth daily.     ondansetron (ZOFRAN-ODT) 4 MG disintegrating tablet Take 4 mg by mouth every 8 (eight)  hours as needed. (Patient not taking: Reported on 05/13/2022)     propranolol (INDERAL) 80 MG tablet Take 1 tablet (80 mg total) by mouth daily. 90 tablet 3   rizatriptan (MAXALT) 5 MG tablet TAKE 1 TABLET BY MOUTH EVERY DAY AS NEEDED FOR MIGRAINE (Patient not taking: Reported on 05/13/2022) 30 tablet 2   sodium chloride (OCEAN) 0.65 % SOLN nasal spray Place 2 sprays into both nostrils every 2 (two) hours while awake.  0   traMADol (ULTRAM) 50 MG tablet as needed.     traZODone (DESYREL) 50 MG tablet TAKE 1 TO 2 TABLETS AT BEDTIME AS NEEDED (Patient taking differently: as needed. TAKE 1 TO 2 TABLETS AT BEDTIME AS NEEDED) 60 tablet 1   zolpidem (AMBIEN) 10 MG tablet Take 10 mg by mouth at bedtime as needed.     No current  facility-administered medications on file prior to visit.     Allergies: Allergies  Allergen Reactions   Tape Rash   Aluminum Chlorohydrate Itching   Cefixime Hives   Fentanyl Itching    Other reaction(s): Unknown   Olmesartan Medoxomil-Hctz Other (See Comments)    Family History: Migraine or other headaches in the family:  mother has migraines Aneurysms in a first degree relative:  no Brain tumors in the family:  no Other neurological illness in the family:   mother has essential tremor  Past Medical History: Past Medical History:  Diagnosis Date   Facial numbness    Headache    Hypertension    Migraine    Paresthesias     Past Surgical History Past Surgical History:  Procedure Laterality Date   BREAST BIOPSY Left 06/13/2011   benign done at Dr. Rutherford Nail office   BREAST EXCISIONAL BIOPSY     CESAREAN SECTION  2010   SPINAL FUSION N/A    L4 - S1 (Has had spinal fusion surgery 3 times)    Social History: Social History   Tobacco Use   Smoking status: Never   Smokeless tobacco: Never  Substance Use Topics   Alcohol use: Never   Drug use: Never    ROS: Negative for fevers, chills. Positive for headaches, facial numbness. All other systems reviewed and negative unless stated otherwise in HPI.   PHYSICAL EXAM: N/A d/t visit type         IMPRESSION: 42 year old female with a history of HTN, migraine, anxiety, depression, s/p lumbar fusion who presents for evaluation of headaches, ear pressure, and right facial numbness likely due to trigeminal neuralgia.  Evaluated by Dr. Delena Bali on 05/13/2022.  Brain MRI was unremarkable.  No significant benefit with PT. Unable to tolerate carbamazepine, gabapentin and lamotrigine and no benefit with muscle relaxants.      PLAN: -No further oral medication options available at this time -Unable to pursue surgical options at this time due to caring for her father recently diagnosed with dementia. She will call when  interested in pursing.  -discussed trial of Elma ointment as needed for increased pain, discussed potential side effects -Previously ordered MRI trigeminal but not completed, can hold off at this time but consider pursuing with any worsening symptoms     No further recommendations at this time he can return back to PCP for ongoing monitoring.  She is advised to call with any questions or concerns in the future    I spent 25 minutes of face-to-face and non-face-to-face time with patient via MyChart video visit.  This included previsit chart review, lab review,  study review, order entry, electronic health record documentation, patient education and discussion regarding the above and answered all the questions to patient satisfaction  Ihor Austin, [facility]  [facility] [address] [place], [place] [address]  Phone [phone] Fax [phone] Note: This document was prepared with digital dictation and possible smart phrase technology. Any transcriptional errors that result from this process are unintentional.

## 2023-09-01 ENCOUNTER — Other Ambulatory Visit: Payer: Self-pay | Admitting: Orthopedic Surgery

## 2023-09-01 DIAGNOSIS — S93409S Sprain of unspecified ligament of unspecified ankle, sequela: Secondary | ICD-10-CM

## 2023-09-07 ENCOUNTER — Encounter: Payer: Self-pay | Admitting: Orthopedic Surgery

## 2023-09-08 ENCOUNTER — Encounter: Payer: Self-pay | Admitting: Orthopedic Surgery

## 2023-09-14 ENCOUNTER — Ambulatory Visit
Admission: RE | Admit: 2023-09-14 | Discharge: 2023-09-14 | Disposition: A | Discharge status: Home / Self Care | Payer: 59 | Source: Ambulatory Visit | Attending: Orthopedic Surgery | Admitting: Orthopedic Surgery

## 2023-09-14 DIAGNOSIS — S93409S Sprain of unspecified ligament of unspecified ankle, sequela: Secondary | ICD-10-CM

## 2024-06-24 ENCOUNTER — Other Ambulatory Visit: Payer: Self-pay | Admitting: Internal Medicine

## 2024-06-24 DIAGNOSIS — Z1231 Encounter for screening mammogram for malignant neoplasm of breast: Secondary | ICD-10-CM

## 2024-10-03 ENCOUNTER — Telehealth: Payer: Self-pay | Admitting: Adult Health

## 2024-10-03 NOTE — Telephone Encounter (Signed)
 April Griffith  from [facility]   to request to speak to nurse abour if Pt need to be Premedication or not be pre medication . They are requesting a letter that states either or    Phone [phone] Fax [phone]

## 2024-10-03 NOTE — Telephone Encounter (Signed)
 Does patient need to be premedicated? If so would you be okay signing a letter stating this?

## 2024-10-03 NOTE — Telephone Encounter (Signed)
 I am not sure why they are asking us  if she needs to be premedicated and what is the procedure she is having done? Patient is no longer a patient here as she was released back to her PCP.  Her dentist can contact PCP regarding this.

## 2024-10-03 NOTE — Telephone Encounter (Signed)
 Patient was needing possible root canal and crowns.  April Griffith was informed that patient is no longer a patient here and that she would have to call the PCP to get clarification on whether she needed to be medicated prior or not.
